# Patient Record
Sex: Male | Born: 1975 | Race: White | Hispanic: No | Marital: Married | Smoking: Former smoker
Health system: Southern US, Community
[De-identification: ages and names within clinical notes are randomized; demographics above are authoritative.]

## PROBLEM LIST (undated history)

## (undated) DIAGNOSIS — F32A Depression, unspecified: Secondary | ICD-10-CM

## (undated) DIAGNOSIS — I10 Essential (primary) hypertension: Secondary | ICD-10-CM

## (undated) DIAGNOSIS — K219 Gastro-esophageal reflux disease without esophagitis: Secondary | ICD-10-CM

## (undated) DIAGNOSIS — F329 Major depressive disorder, single episode, unspecified: Secondary | ICD-10-CM

## (undated) DIAGNOSIS — F419 Anxiety disorder, unspecified: Secondary | ICD-10-CM

## (undated) HISTORY — DX: Anxiety disorder, unspecified: F41.9

## (undated) HISTORY — DX: Major depressive disorder, single episode, unspecified: F32.9

## (undated) HISTORY — DX: Depression, unspecified: F32.A

## (undated) HISTORY — DX: Gastro-esophageal reflux disease without esophagitis: K21.9

## (undated) HISTORY — DX: Essential (primary) hypertension: I10

---

## 2004-12-03 ENCOUNTER — Emergency Department (HOSPITAL_COMMUNITY): Admission: EM | Admit: 2004-12-03 | Discharge: 2004-12-03 | Payer: Self-pay | Admitting: Emergency Medicine

## 2005-03-15 ENCOUNTER — Encounter: Admission: RE | Admit: 2005-03-15 | Discharge: 2005-03-15 | Payer: Self-pay | Admitting: Family Medicine

## 2008-06-28 ENCOUNTER — Emergency Department (HOSPITAL_COMMUNITY): Admission: EM | Admit: 2008-06-28 | Discharge: 2008-06-28 | Payer: Self-pay | Admitting: Family Medicine

## 2008-06-28 ENCOUNTER — Emergency Department (HOSPITAL_COMMUNITY): Admission: EM | Admit: 2008-06-28 | Discharge: 2008-06-28 | Payer: Self-pay | Admitting: Emergency Medicine

## 2008-07-27 ENCOUNTER — Inpatient Hospital Stay (HOSPITAL_COMMUNITY): Admission: AD | Admit: 2008-07-27 | Discharge: 2008-07-31 | Payer: Self-pay | Admitting: Psychiatry

## 2008-07-27 ENCOUNTER — Ambulatory Visit: Payer: Self-pay | Admitting: Psychiatry

## 2008-08-02 ENCOUNTER — Other Ambulatory Visit (HOSPITAL_COMMUNITY): Admission: RE | Admit: 2008-08-02 | Discharge: 2008-08-24 | Payer: Self-pay | Admitting: Psychiatry

## 2008-09-13 ENCOUNTER — Inpatient Hospital Stay (HOSPITAL_COMMUNITY): Admission: AD | Admit: 2008-09-13 | Discharge: 2008-09-21 | Payer: Self-pay | Admitting: Psychiatry

## 2008-09-13 ENCOUNTER — Ambulatory Visit: Payer: Self-pay | Admitting: Psychiatry

## 2008-09-27 ENCOUNTER — Ambulatory Visit (HOSPITAL_COMMUNITY): Payer: Self-pay | Admitting: Psychiatry

## 2010-10-17 LAB — URINALYSIS, ROUTINE W REFLEX MICROSCOPIC
Hgb urine dipstick: NEGATIVE
Ketones, ur: 15 mg/dL — AB
Nitrite: NEGATIVE
Protein, ur: NEGATIVE mg/dL
Specific Gravity, Urine: 1.04 — ABNORMAL HIGH (ref 1.005–1.030)
Urobilinogen, UA: 1 mg/dL (ref 0.0–1.0)

## 2010-10-17 LAB — DRUGS OF ABUSE SCREEN W/O ALC, ROUTINE URINE
Amphetamine Screen, Ur: NEGATIVE
Benzodiazepines.: NEGATIVE
Marijuana Metabolite: NEGATIVE
Methadone: NEGATIVE

## 2010-10-17 LAB — COMPREHENSIVE METABOLIC PANEL
ALT: 12 U/L (ref 0–53)
AST: 12 U/L (ref 0–37)
Albumin: 4.6 g/dL (ref 3.5–5.2)
Alkaline Phosphatase: 61 U/L (ref 39–117)
CO2: 26 mEq/L (ref 19–32)
Calcium: 9.4 mg/dL (ref 8.4–10.5)
Creatinine, Ser: 0.97 mg/dL (ref 0.4–1.5)
GFR calc Af Amer: >60 mL/min (ref >60)
Sodium: 139 mEq/L (ref 135–145)

## 2010-10-17 LAB — CBC
MCHC: 33.6 g/dL (ref 30.0–36.0)
MCV: 84.9 fL (ref 78.0–100.0)
Platelets: 271 10*3/uL (ref 150–400)
WBC: 9.6 10*3/uL (ref 4.0–10.5)

## 2010-10-17 LAB — URINE MICROSCOPIC-ADD ON

## 2010-10-21 LAB — DIFFERENTIAL
Basophils Absolute: 0 10*3/uL (ref 0.0–0.1)
Basophils Relative: 1 % (ref 0–1)
Eosinophils Absolute: 0.4 10*3/uL (ref 0.0–0.7)
Eosinophils Relative: 5 % (ref 0–5)
Monocytes Absolute: 0.6 10*3/uL (ref 0.1–1.0)
Monocytes Relative: 9 % (ref 3–12)
Neutro Abs: 3.8 10*3/uL (ref 1.7–7.7)
Neutrophils Relative %: 52 % (ref 43–77)

## 2010-10-21 LAB — COMPREHENSIVE METABOLIC PANEL
ALT: 18 U/L (ref 0–53)
AST: 17 U/L (ref 0–37)
Albumin: 4.2 g/dL (ref 3.5–5.2)
Alkaline Phosphatase: 56 U/L (ref 39–117)
CO2: 27 mEq/L (ref 19–32)
Calcium: 9.6 mg/dL (ref 8.4–10.5)
Chloride: 102 mEq/L (ref 96–112)
Creatinine, Ser: 1.13 mg/dL (ref 0.4–1.5)
GFR calc Af Amer: >60 mL/min (ref >60)
GFR calc non Af Amer: >60 mL/min (ref >60)
Potassium: 3.7 mEq/L (ref 3.5–5.1)
Total Bilirubin: 0.5 mg/dL (ref 0.3–1.2)
Total Protein: 6.5 g/dL (ref 6.0–8.3)

## 2010-10-21 LAB — DRUGS OF ABUSE SCREEN W/O ALC, ROUTINE URINE
Amphetamine Screen, Ur: NEGATIVE
Barbiturate Quant, Ur: NEGATIVE
Benzodiazepines.: NEGATIVE
Marijuana Metabolite: NEGATIVE
Methadone: NEGATIVE
Opiate Screen, Urine: NEGATIVE

## 2010-10-21 LAB — CBC: Hemoglobin: 15.5 g/dL (ref 13.0–17.0)

## 2010-10-21 LAB — COCAINE, URINE, CONFIRMATION: Benzoylecgonine GC/MS Conf: 120000 ng/mL

## 2010-11-19 NOTE — H&P (Signed)
Wesley Fletcher, Wesley Fletcher NO.:  [id number]   MEDICAL RECORD NO.:  [id number]          PATIENT TYPE:  IPS   LOCATION:  0501                          FACILITY:  [facility]   PHYSICIAN:  Geoffery Lyons, M.D.      DATE OF BIRTH:  August 20, 1975   DATE OF ADMISSION:  09/13/2008  DATE OF DISCHARGE:                       PSYCHIATRIC ADMISSION ASSESSMENT   HISTORY OF PRESENT ILLNESS:  The patient is here due to altered mental  status, apparently was using alcohol and smoking marijuana.  He was  brought to the facility per his wife who was concerned about his mental  status.  He denies any hallucinations, denies any suicidality.   PAST PSYCHIATRIC HISTORY:  The patient was here in January 2010 for  stress conflict with his wife and some substance use.  Apparently, he  has been on Paxil and Wellbutrin in the past.   SOCIAL HISTORY:  This is a 35 year old male who lives in [place].  He  is currently unemployed.  He is married.   FAMILY HISTORY:  None.   ALCOHOL/DRUG HISTORY:  He admits to using some recent alcohol and  marijuana with a past history of cocaine use.   PRIMARY CARE Marithza Malachi:  None.   MEDICAL PROBLEMS:  He denies any acute or chronic health issues.   MEDICATIONS:  1. Wellbutrin 150 mg.  2. Trazodone 100 mg at bedtime.  3. Paxil 30 mg daily.   DRUG ALLERGIES:  No known allergies.   REVIEW OF SYSTEMS:  The patient denies any fever, chills, insomnia, no  weight gain or chest pain, shortness of breath, nausea, vomiting, joint  pain, seizures, headaches or depression.   PAST SURGICAL HISTORY:  None.   PHYSICAL EXAMINATION:  VITAL SIGNS:  Temperature 97.9, heart rate 80,  respirations 20, blood pressure 127/82.  He is 6 feet 1 inch tall, 194  pounds. GENERAL APPEARANCE:  A healthy-appearing male in no acute  distress.  HEENT:  Head is atraumatic.  NECK:  No lymphadenopathy.  CHEST:  Clear, no wheezes.  No rales.  BREAST EXAM:  Deferred.  HEART:  Regular rate and  rhythm.  No murmurs, gallops or rubs.  ABDOMEN:  Soft, nontender and nondistended abdomen.  PELVIC AND GU EXAMS:  Deferred.  EXTREMITIES:  Moves all extremities.  No clubbing, no edema, 5+ against  resistance.  SKIN:  Warm and dry with no obvious rashes or lacerations noted.  NEUROLOGICAL FINDINGS:  Intact with no tremors.  The patient walks  upright with a steady gait.   LABORATORY DATA:  Shows a glucose of 110.  CBC within normal limits.  Urinalysis shows amber turbid urine.  Urine drug screen is negative.   MENTAL STATUS EXAMINATION:  The patient is fully alert and cooperative  with good eye contact.  No obvious thought disorder; polite.  His memory  appears intact, although he is a poor historian as to recent events and  circumstances that surrounded admission.  Judgment and insight are fair.   Axis I:  Polysubstance abuse, rule out substance-induced psychosis.  Axis II:  Deferred.  Axis III:  No known medical conditions.  Axis IV:  Psychosocial problems related to chronic substance use.  Axis V:  Current is 40-45.   PLAN:  Contract for safety.  We will have a family session for  background information.  Concerns at home.  Will review patient's  medication regime.  The patient will be in the red group at this time.  Tentative length of stay is 2-3 days.      Landry Corporal, N.P.      Geoffery Lyons, M.D.  Electronically Signed    JO/MEDQ  D:  09/20/2008  T:  09/20/2008  Job:  [id number]

## 2010-11-19 NOTE — Discharge Summary (Signed)
NAMESARKIS, Wesley Fletcher NO.:  [id number]   MEDICAL RECORD NO.:  [id number]          PATIENT TYPE:  IPS   LOCATION:  0503                          FACILITY:  [facility]   PHYSICIAN:  Geoffery Lyons, M.D.      DATE OF BIRTH:  07-Jul-1976   DATE OF ADMISSION:  07/27/2008  DATE OF DISCHARGE:  07/31/2008                               DISCHARGE SUMMARY   CHIEF COMPLAINT:  This was the was the first admission to [facility] for this 35 year old male married x2 divorced x1 with  3 children in 74, 9 and 5.  Says he had been under a lot of stress,  conflict with the ex-wife.  He claimed that she had cheated twice on  him.  He remarried.  This is a better relationship.  The kids are having  a hard time going back and forth.  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  Increased hostility from the clients, stress,  increasingly more irritable, easily agitated, afraid of losing control.  Admits to using cocaine, increased use, increased amounts.   PAST PSYCHIATRIC HISTORY:  Had been on Paxil and Wellbutrin.  Had been  on Xanax.  He used to take the Xanax when he one of those episodes, when  he felt he was losing control.  He started taking increased amount, 3  and 4 at a time.   SECONDARY HISTORY:  From 17 to 44, has used LSD, marijuana, mushrooms,  MDA, peyote with some perceptual changes, mostly visual.   MEDICAL HISTORY:  Noncontributory.   PHYSICAL EXAM:  Failed to show any acute findings.   LABORATORY WORKUP:  UDS positive for cocaine.  CBC within normal limits.  BMET within normal limits.   MENTAL STATUS EXAMINATION:  Reveals alert cooperative, spontaneous man,  alert and cooperative spontaneous male, wanting to get better, wanting  to quit.  Endorsed using is affecting his ability to function, affecting  relationships.  Understanding that he needed to make a lot of changes in  his life.  No active suicidal or homicidal ideas.  No  hallucinations or  delusions.  Cognition well preserved.   ADMITTING DIAGNOSES:  AXIS I:  Cocaine abuse, rule out dependence.  Mood  disorder not otherwise specified.  AXIS II:  No diagnosis.  AXIS III:  Active hypertension.  AXIS IV:  Moderate.  AXIS V:  On admission 35.  Highest GAF in the last year 60.   COURSE IN THE HOSPITAL:  Was admitted, started individual and group  psychotherapy.  We provided some Symmetrel for cravings.  He was kept on  his Paxil as well as the Wellbutrin.  He was given trazodone for sleep  and he continued to endorse visual disturbances, different patterns in  the wall.  There was a family session with his wife.  He endorsed stress  from work.  Wife reports that he gets home and just sleeps.  No  motivation.  He endorsed having little interest in things, feeling he  was just concerned for everyone else.  He endorsed that he is ashamed  and does not want to be a burden, so does not share with her.  Has been  using cocaine for the last year.  Wife was aware of this.  Overall, the  session went well.  The next 48 hours continued to work on coping skills  and January 25 he was in full contact reality.  There were no active  suicidal ideas, no delusions.  No hallucinations.  He was willing and  motivated to pursue further work on recovery.   DISCHARGE DIAGNOSES:  AXIS I: Cocaine dependence.  Mood disorder not  otherwise specified.  Anxiety disorder not otherwise specified.  AXIS II:  No diagnosis.  AXIS III:  Hypertension.  AXIS IV:  Moderate.  AXIS V:  On discharge 60.   DISCHARGE MEDICATIONS:  1. Paxil 70 mg per day.  2. Symmetrel 100 mg twice a day.  3. Wellbutrin XL 150 mg in the morning.  4. Trazodone 100 one-half to one at night for sleep.   FOLLOWUP:  CD IOP.      Geoffery Lyons, M.D.  Electronically Signed     IL/MEDQ  D:  08/12/2008  T:  08/13/2008  Job:  [id number]

## 2010-11-22 NOTE — Discharge Summary (Signed)
Wesley Fletcher, Wesley Fletcher   MEDICAL RECORD NO.:  [id number]          PATIENT TYPE:  IPS   LOCATION:  0501                          FACILITY:  [facility]   PHYSICIAN:  Geoffery Lyons, M.D.      DATE OF BIRTH:  Nov 07, 1975   DATE OF ADMISSION:  09/13/2008  DATE OF DISCHARGE:  09/21/2008                               DISCHARGE SUMMARY   CHIEF COMPLAINT AND PRESENT ILLNESS:  This was the second admission to  [facility] for this 35 year old male that was admitted  with altered mental status.  Apparently was using alcohol and smoking  marijuana.  Brought to this facility by his wife who was concerned about  his mental state.   PAST PSYCHIATRIC HISTORY:  Was admitted on January 2010, has some  conflict with his wife and some substance abuse.  Had been on Paxil and  Wellbutrin in the past.   ALCOHOL AND DRUG HISTORY:  As already stated, abuse of alcohol and  marijuana, past history of cocaine abuse.   MEDICAL HISTORY:  Noncontributory.   MEDICATIONS:  1. Wellbutrin 150 mg per day.  2. Trazodone 100 mg at bedtime.  3. Paxil 30 at bedtime for sleep.   PHYSICAL EXAMINATION:  Failed to show any acute findings.   LABORATORY WORK:  White blood cells 9.6, hemoglobin 16.1.  Sodium 139,  potassium 3.5, glucose 110.  SGOT 12, SGPT 12, total bilirubin 0.8.  Drug screening negative for substances of abuse.   MENTAL STATUS EXAMINATION:  Exam reveals alert cooperative male with  good eye contact.  Mood:  Anxiety.  Affect:  Anxiety.  Ruminative  circumstantial, somewhat tangential, but no active suicidal or homicidal  ideas.  No delusions.  No hallucinations.  Cognition:  Well-preserved.   ADMISSION DIAGNOSES:  AXIS I:  Polysubstance abuse, rule out substance-  induced mental status change.  AXIS II:  No diagnosis.  AXIS III:  No diagnosis.  AXIS IV:  Moderate.  AXIS V:  On admission 35-40, highest GAF in the last year 70.   COURSE IN THE HOSPITAL:   Patient was admitted, started in individual and  group psychotherapy.  Initially, he endorsed that he was admitted  because he relapsed, was wanting to leave and go with his family.  Claimed that he was __________ the day before because I had delusions.  March 12, he was more disorganized, loud, tearful, uncontrollable  crying, thinking about his son, referring him in an abstract way I have  him in my heart.  Having memories of him being a 35 year old and  playing the guitar.  He did admit that he sees himself as an addict.  Was a family session with his wife.  They have been dealing with her  parents due to financial concerns.  She did say that [place] relied  heavily on her.  She was concerned about his mental status changes.  Concerned that they might be impairment and due to years of past drug  use.  After this incident, her parents did not want [place] back in their  home.  He had  a hard time focusing on the task in front of him.  At  times, his answers were irrelevant.  By March 13, he was more organized,  able to channel other events in his life in which he was involved with  drugs.  Afraid that he had an overdose of LSD where he was preparing to  sell it, concerned that it might have affected his brain.  Did admit to  episodes of paranoia.  While interacting with this physician, he was  suspicious, and he was able to recognize that.  He was already on  Zyprexa.  We increased it to 10 mg at night.  March 14, he was somewhat  better, less labile.  It was clear he was not going to be able to go  home with his wife, so he would not have to find another accommodation.  March 15, he was ruminating, able to say that he has experienced  paranoia for a long time.  Also endorsed that the mother shared that he  had some compulsive behaviors in the past.  He used to wash his hands  over and over again.  Also has some social anxiety.  Endorsed difficulty  with memory, still ruminating.  Looking  into options after discharge,  they were looking at a halfway house.  He evidenced difficulty with  concentration and with short-term memory.  March 16, still somewhat  disorganized, dealing with all the losses through the years, worried  about the outcome of the relationship between him and his wife,  uncertain as what was going to happen once he gets out, where would he  go, how can he find a job, how can he be able to make enough money to  keep the halfway house going.  He was somewhat labile, but March 17, he  was more organized.  He was able to start drawing, endorsed he has  increased awareness, increased ability to focus on his drawings.  Overall, he felt better, marked decrease in the distractibility and the  lability of affect.  Did say that he had a lot of unresolved grief  issues that he needs to address.  Continued to work with the Zyprexa.  We worked on Pharmacologist, grief and loss, and we worked towards  improving reality testing.  By March 18, he was better, still not  completely 100%, but able to stay focused, able to with help of the  skills he had just learned keep himself on task, focused, organized.  He  is going to a halfway house.  He has to make sure he was able to get a  job that will help to support himself and his family.  Upon discharge,  in full contact with reality.  No active suicidal or homicidal ideas.  No hallucinations or delusions.   DISCHARGE DIET:  AXIS I:  Anxiety disorder, not otherwise specified.  Mood disorder, not otherwise specified.  Polysubstance abuse.  Psychotic  disorder, not otherwise specified.  AXIS II:  No diagnosis.  AXIS III:  No diagnosis.  AXIS IV:  Moderate.  AXIS V:  On discharge 50.   Discharged on:  1. Zyprexa 5 mg twice a day and 10 mg at bedtime.  2. Trazodone 100 mg at bedtime.   FOLLOW-UP:  Saul Fordyce, [facility].      Geoffery Lyons, M.D.  Electronically Signed     IL/MEDQ  D:  10/17/2008  T:   10/17/2008  Job:  [id number]

## 2010-12-07 ENCOUNTER — Emergency Department (HOSPITAL_COMMUNITY): Payer: 59

## 2010-12-07 ENCOUNTER — Emergency Department (HOSPITAL_COMMUNITY)
Admission: EM | Admit: 2010-12-07 | Discharge: 2010-12-07 | Disposition: A | Discharge status: Home / Self Care | Payer: 59 | Attending: Emergency Medicine | Admitting: Emergency Medicine

## 2010-12-07 DIAGNOSIS — R072 Precordial pain: Secondary | ICD-10-CM | POA: Insufficient documentation

## 2010-12-07 DIAGNOSIS — I10 Essential (primary) hypertension: Secondary | ICD-10-CM | POA: Insufficient documentation

## 2010-12-07 LAB — CBC
HCT: 42.9 % (ref 39.0–52.0)
MCV: 79.4 fL (ref 78.0–100.0)
Platelets: 205 10*3/uL (ref 150–400)
RBC: 5.4 MIL/uL (ref 4.22–5.81)
RDW: 12.5 % (ref 11.5–15.5)
WBC: 5.8 10*3/uL (ref 4.0–10.5)

## 2010-12-07 LAB — BASIC METABOLIC PANEL
BUN: 26 mg/dL — ABNORMAL HIGH (ref 6–23)
Creatinine, Ser: 0.78 mg/dL (ref 0.4–1.5)
GFR calc Af Amer: >60 mL/min (ref >60)
GFR calc non Af Amer: >60 mL/min (ref >60)
Potassium: 4 mEq/L (ref 3.5–5.1)

## 2010-12-07 LAB — RAPID URINE DRUG SCREEN, HOSP PERFORMED
Barbiturates: NOT DETECTED
Cocaine: NOT DETECTED
Tetrahydrocannabinol: NOT DETECTED

## 2010-12-07 LAB — DIFFERENTIAL
Basophils Absolute: 0 10*3/uL (ref 0.0–0.1)
Basophils Relative: 0 % (ref 0–1)
Eosinophils Absolute: 0.2 10*3/uL (ref 0.0–0.7)
Eosinophils Relative: 3 % (ref 0–5)
Lymphs Abs: 2.3 10*3/uL (ref 0.7–4.0)
Neutrophils Relative %: 45 % (ref 43–77)

## 2010-12-07 LAB — CK TOTAL AND CKMB (NOT AT ARMC): Relative Index: 1.4 (ref 0.0–2.5)

## 2010-12-07 LAB — TROPONIN I: Troponin I: <0.3 ng/mL (ref <0.30)

## 2010-12-12 ENCOUNTER — Encounter (HOSPITAL_COMMUNITY): Payer: 59 | Admitting: Radiology

## 2010-12-18 ENCOUNTER — Encounter (HOSPITAL_COMMUNITY): Payer: 59 | Admitting: Radiology

## 2011-01-14 ENCOUNTER — Encounter: Payer: Self-pay | Admitting: *Deleted

## 2011-01-14 ENCOUNTER — Encounter: Payer: Self-pay | Admitting: Cardiovascular Disease

## 2011-01-15 ENCOUNTER — Encounter: Payer: Self-pay | Admitting: Cardiovascular Disease

## 2011-01-15 ENCOUNTER — Ambulatory Visit (INDEPENDENT_AMBULATORY_CARE_PROVIDER_SITE_OTHER): Payer: Managed Care, Other (non HMO) | Admitting: Cardiovascular Disease

## 2011-01-15 DIAGNOSIS — R079 Chest pain, unspecified: Secondary | ICD-10-CM

## 2011-01-15 DIAGNOSIS — R002 Palpitations: Secondary | ICD-10-CM

## 2011-01-15 NOTE — Assessment & Plan Note (Signed)
Atypical negative ER w/U  F/U stress echo

## 2011-01-15 NOTE — Progress Notes (Signed)
35 yo seen in ER for atypical SSCP on 6/2.  Reviewed records, labs and ECG.  R/O, CXR NAD and ECG normal.  Pain atypical sharp and fleeting.  Has had occasional palpitations with premature beat and strong second beat.  Currently not recurring.  Previous history of drug use but not last 3 years at least.  No excess caffeine.  Lots of stress Works for [employer] which is in bankrupcy and has 3 kids under the age of 73 and a 4th on the way.  No syncope, family history of premature CAD or collapse.    ROS: Denies fever, malais, weight loss, blurry vision, decreased visual acuity, cough, sputum, SOB, hemoptysis, pleuritic pain, palpitaitons, heartburn, abdominal pain, melena, lower extremity edema, claudication, or rash.  All other systems reviewed and negative   General: Affect appropriate Healthy:  appears stated age HEENT: normal Neck supple with no adenopathy JVP normal no bruits no thyromegaly Lungs clear with no wheezing and good diaphragmatic motion Heart:  S1/S2 no murmur,rub, gallop or click PMI normal Abdomen: benighn, BS positve, no tenderness, no AAA no bruit.  No HSM or HJR Distal pulses intact with no bruits No edema Neuro non-focal Skin warm and dry No muscular weakness  Medications Current Outpatient Prescriptions  Medication Sig Dispense Refill  . aspirin 81 MG tablet Take 81 mg by mouth daily.        . Multiple Vitamin (MULTIVITAMIN) capsule Take 1 capsule by mouth daily.        . NON FORMULARY St john wart As directed         Allergies Review of patient's allergies indicates no known allergies.  Family History: No family history on file.  Social History: History   Social History  . Marital Status: Married    Spouse Name: N/A    Number of Children: N/A  . Years of Education: N/A   Occupational History  . Not on file.   Social History Main Topics  . Smoking status: Current Some Day Smoker  . Smokeless tobacco: Not on file  . Alcohol Use: Not on file  .  Drug Use: Not on file  . Sexually Active: Not on file   Other Topics Concern  . Not on file   Social History Narrative  . No narrative on file    Electrocardiogram:  12/07/10  NSR 69 Normal ECG  Assessment and Plan

## 2011-01-15 NOTE — Assessment & Plan Note (Signed)
Benign related to stress No need for further w.u unless stress echo abnormal

## 2011-01-29 ENCOUNTER — Ambulatory Visit (HOSPITAL_COMMUNITY): Payer: Managed Care, Other (non HMO) | Admitting: Radiology

## 2013-07-18 ENCOUNTER — Ambulatory Visit (INDEPENDENT_AMBULATORY_CARE_PROVIDER_SITE_OTHER): Payer: BC Managed Care – PPO | Admitting: Physician Assistant

## 2013-07-18 VITALS — BP 122/76 | HR 76 | Temp 98.3°F | Resp 12 | Ht 73.0 in | Wt 219.0 lb

## 2013-07-18 DIAGNOSIS — L309 Dermatitis, unspecified: Secondary | ICD-10-CM

## 2013-07-18 DIAGNOSIS — L299 Pruritus, unspecified: Secondary | ICD-10-CM

## 2013-07-18 DIAGNOSIS — L259 Unspecified contact dermatitis, unspecified cause: Secondary | ICD-10-CM

## 2013-07-18 MED ORDER — HYDROXYZINE HCL 25 MG PO TABS: 12.5000 mg | ORAL_TABLET | Freq: Every evening | ORAL | Status: DC | PRN

## 2013-07-18 MED ORDER — PREDNISONE 20 MG PO TABS: ORAL_TABLET | ORAL | Status: DC

## 2013-07-18 NOTE — Progress Notes (Signed)
   Subjective:    Patient ID: Wesley BouchardCurtis Fletcher, male    DOB: 12/14/1975, 38 y.o.   MRN: [id number]  Reviewed patient medication, past medical history, social history and family history.  HPI  Arms starting itching and breaking out with rash on 07/15/2013.  Has been gradually spreading every since it started.  Now covering arms, legs, trunk, back and neck. Has taken Benadryl 25 mg BID x 2 days but does not seem to improve itching.  No change in detergent or soap.  No fever, sore throat or nasal congestion.   Is having trouble paying attention.  Getting lost when driving.  Having issues staying on task at work. Has to keep door closed to stay focused.  Diagnosed in 1992 with ADD but never took medication.  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  Noticed attention span worsening over past 6 years.   Review of Systems As written above.     Objective:   Physical Exam  Constitutional: He is oriented to person, place, and time. Vital signs are normal. He appears well-developed and well-nourished.  Cardiovascular: Normal rate, regular rhythm and normal heart sounds.   Pulmonary/Chest: Effort normal and breath sounds normal.  Neurological: He is alert and oriented to person, place, and time.  Skin: Skin is warm and dry. Rash noted. Rash is papular.  Erythematous, tiny and small papular rash on arms, legs, neck, back and chest.            Assessment & Plan:  1. Pruritus - hydrOXYzine (ATARAX/VISTARIL) 25 MG tablet; Take 0.5-1 tablets (12.5-25 mg total) by mouth at bedtime as needed for itching.  Dispense: 30 tablet; Refill: 0  2. Dermatitis - predniSONE (DELTASONE) 20 MG tablet; Take 3 PO QAM x3days, 2 PO QAM x3days, 1 PO QAM x3days  Dispense: 18 tablet; Refill: 0

## 2013-07-18 NOTE — Progress Notes (Signed)
I have examined this patient along with the student and agree. Undetermined etiology. Counseled regarding dry skin care, which may help, though his skin is not particularly dry.  Encouraged him to establish for primary care to further evaluate the ADD symptoms and to initiate treatment.

## 2013-07-18 NOTE — Patient Instructions (Addendum)
We encourage you to establish with PCP of your choice at your earliest convenience.   Drink at least 64 ounces of water daily. Consider a humidifier for the room where you sleep. Bathe once daily. Avoid using HOT water, as it dries skin. Avoid deodorant soaps (Dial is the worst!) and stick with gentle cleansers (I like Cetaphil Liquid Cleanser). After bathing, dry off completely, then apply a thick emollient cream (I like Cetaphil Moisturizing Cream). Apply the cream twice daily, or more!

## 2013-10-26 ENCOUNTER — Ambulatory Visit (INDEPENDENT_AMBULATORY_CARE_PROVIDER_SITE_OTHER): Payer: BC Managed Care – PPO | Admitting: Family Medicine

## 2013-10-26 ENCOUNTER — Encounter: Payer: Self-pay | Admitting: Family Medicine

## 2013-10-26 VITALS — BP 120/80 | HR 75 | Temp 98.5°F | Ht 73.0 in | Wt 223.0 lb

## 2013-10-26 DIAGNOSIS — Z7689 Persons encountering health services in other specified circumstances: Secondary | ICD-10-CM

## 2013-10-26 DIAGNOSIS — Z7189 Other specified counseling: Secondary | ICD-10-CM

## 2013-10-26 DIAGNOSIS — K219 Gastro-esophageal reflux disease without esophagitis: Secondary | ICD-10-CM

## 2013-10-26 DIAGNOSIS — F3289 Other specified depressive episodes: Secondary | ICD-10-CM

## 2013-10-26 DIAGNOSIS — F32A Depression, unspecified: Secondary | ICD-10-CM

## 2013-10-26 DIAGNOSIS — F329 Major depressive disorder, single episode, unspecified: Secondary | ICD-10-CM

## 2013-10-26 NOTE — Progress Notes (Signed)
Pre visit review using our clinic review tool, if applicable. No additional management support is needed unless otherwise documented below in the visit note. 

## 2013-10-26 NOTE — Patient Instructions (Signed)
-  PLEASE SIGN UP FOR MYCHART TODAY   We recommend the following healthy lifestyle measures: - eat a healthy diet consisting of lots of vegetables, fruits, beans, nuts, seeds, healthy meats such as white chicken and fish and whole grains.  - avoid fried foods, fast food, processed foods, sodas, red meet and other fattening foods.  - get a least 150 minutes of aerobic exercise per week.   Follow up in: next few months for physical with labs  Diet for Gastroesophageal Reflux Disease, Adult Reflux (acid reflux) is when acid from your stomach flows up into the esophagus. When acid comes in contact with the esophagus, the acid causes irritation and soreness (inflammation) in the esophagus. When reflux happens often or so severely that it causes damage to the esophagus, it is called gastroesophageal reflux disease (GERD). Nutrition therapy can help ease the discomfort of GERD. FOODS OR DRINKS TO AVOID OR LIMIT  Smoking or chewing tobacco. Nicotine is one of the most potent stimulants to acid production in the gastrointestinal tract.  Caffeinated and decaffeinated coffee and black tea.  Regular or low-calorie carbonated beverages or energy drinks (caffeine-free carbonated beverages are allowed).   Strong spices, such as black pepper, white pepper, red pepper, cayenne, curry powder, and chili powder.  Peppermint or spearmint.  Chocolate.  High-fat foods, including meats and fried foods. Extra added fats including oils, butter, salad dressings, and nuts. Limit these to less than 8 tsp per day.  Fruits and vegetables if they are not tolerated, such as citrus fruits or tomatoes.  Alcohol.  Any food that seems to aggravate your condition. If you have questions regarding your diet, call your caregiver or a registered dietitian. OTHER THINGS THAT MAY HELP GERD INCLUDE:   Eating your meals slowly, in a relaxed setting.  Eating 5 to 6 small meals per day instead of 3 large meals.  Eliminating  food for a period of time if it causes distress.  Not lying down until 3 hours after eating a meal.  Keeping the head of your bed raised 6 to 9 inches (15 to 23 cm) by using a foam wedge or blocks under the legs of the bed. Lying flat may make symptoms worse.  Being physically active. Weight loss may be helpful in reducing reflux in overweight or obese adults.  Wear loose fitting clothing EXAMPLE MEAL PLAN This meal plan is approximately 2,000 calories based on https://www.[place].org/ChooseMyPlate.gov meal planning guidelines. Breakfast   cup cooked oatmeal.  1 cup strawberries.  1 cup low-fat milk.  1 oz almonds. Snack  1 cup cucumber slices.  6 oz yogurt (made from low-fat or fat-free milk). Lunch  2 slice whole-wheat bread.  2 oz sliced [place]urkey.  2 tsp mayonnaise.  1 cup blueberries.  1 cup snap peas. Snack  6 whole-wheat crackers.  1 oz string cheese. Dinner   cup brown rice.  1 cup mixed veggies.  1 tsp olive oil.  3 oz grilled fish. Document Released: 06/23/2005 Document Revised: 09/15/2011 Document Reviewed: 05/09/2011 [facility] Patient Information 2014 [place], [place].

## 2013-10-26 NOTE — Progress Notes (Signed)
No chief complaint on file.   HPI:  Wesley BouchardCurtis Fletcher is here to establish care. Did not have PCP in the past. Last PCP and physical: no, has been several years  Has the following chronic problems and concerns today:  Depression: -when going thru divorce was on xanax and wellbutrin - did not like these -has been taking Dt. John's wort for 3 years and feels great on this (half the recommended dose) -reports doing great on this  GERD: -a few times per week -mild, wants to work on diet   Patient Active Problem List   Diagnosis Date Noted  . GERD (gastroesophageal reflux disease) 10/26/2013   Health Maintenance: -wants to get tdap at physical  ROS: See pertinent positives and negatives per HPI.  Past Medical History  Diagnosis Date  . Chest pain   . Anxiety   . Depression   . HTN (hypertension)     mild, when under stress from divorce    Family History  Problem Relation Age of Onset  . Hypertension Father   . ALS Paternal Grandfather     History   Social History  . Marital Status: Married    Spouse Name: N/A    Number of Children: N/A  . Years of Education: N/A   Social History Main Topics  . Smoking status: Former Smoker    Types: Cigarettes    Quit date: 07/19/2011  . Smokeless tobacco: None  . Alcohol Use: 0.5 - 2.0 oz/week    1-4 drink(s) per week  . Drug Use: No  . Sexual Activity: None   Other Topics Concern  . None   Social History Narrative   Work or School: [employer], route Physicist, medicaloperations manager      Home Situation: lives with wife Noreene LarssonJill and 4 children and another on the way       Spiritual Beliefs: [place]      Lifestyle: no regular exercise; diet is not good                Current outpatient prescriptions:ST JOHNS WORT PO, Take by mouth., Disp: , Rfl:   EXAM:  Filed Vitals:   10/26/13 0947  BP: 120/80  Pulse: 75  Temp: 98.5 F (36.9 C)    Body mass index is 29.43 kg/(m^2).  GENERAL: vitals reviewed and listed above, alert,  oriented, appears well hydrated and in no acute distress  HEENT: atraumatic, conjunttiva clear, no obvious abnormalities on inspection of external nose and ears  NECK: no obvious masses on inspection  LUNGS: clear to auscultation bilaterally, no wheezes, rales or rhonchi, good air movement  CV: HRRR, no peripheral edema  MS: moves all extremities without noticeable abnormality  PSYCH: pleasant and cooperative, no obvious depression or anxiety  ASSESSMENT AND PLAN:  Discussed the following assessment and plan:  GERD (gastroesophageal reflux disease)  Encounter to establish care  Depression   -We reviewed the PMH, PSH, FH, SH, Meds and Allergies. -We provided refills for any medications we will prescribe as needed. -We addressed current concerns per orders and patient instructions. -We have asked for records for pertinent exams, studies, vaccines and notes from previous providers. -We have advised patient to follow up per instructions below.   -Patient advised to return or notify a doctor immediately if symptoms worsen or persist or new concerns arise.  Patient Instructions  -PLEASE SIGN UP FOR MYCHART TODAY   We recommend the following healthy lifestyle measures: - eat a healthy diet consisting of lots of vegetables, fruits,  beans, nuts, seeds, healthy meats such as white chicken and fish and whole grains.  - avoid fried foods, fast food, processed foods, sodas, red meet and other fattening foods.  - get a least 150 minutes of aerobic exercise per week.   Follow up in: next few months for physical with labs  Diet for Gastroesophageal Reflux Disease, Adult Reflux (acid reflux) is when acid from your stomach flows up into the esophagus. When acid comes in contact with the esophagus, the acid causes irritation and soreness (inflammation) in the esophagus. When reflux happens often or so severely that it causes damage to the esophagus, it is called gastroesophageal reflux  disease (GERD). Nutrition therapy can help ease the discomfort of GERD. FOODS OR DRINKS TO AVOID OR LIMIT  Smoking or chewing tobacco. Nicotine is one of the most potent stimulants to acid production in the gastrointestinal tract.  Caffeinated and decaffeinated coffee and black tea.  Regular or low-calorie carbonated beverages or energy drinks (caffeine-free carbonated beverages are allowed).   Strong spices, such as black pepper, white pepper, red pepper, cayenne, curry powder, and chili powder.  Peppermint or spearmint.  Chocolate.  High-fat foods, including meats and fried foods. Extra added fats including oils, butter, salad dressings, and nuts. Limit these to less than 8 tsp per day.  Fruits and vegetables if they are not tolerated, such as citrus fruits or tomatoes.  Alcohol.  Any food that seems to aggravate your condition. If you have questions regarding your diet, call your caregiver or a registered dietitian. OTHER THINGS THAT MAY HELP GERD INCLUDE:   Eating your meals slowly, in a relaxed setting.  Eating 5 to 6 small meals per day instead of 3 large meals.  Eliminating food for a period of time if it causes distress.  Not lying down until 3 hours after eating a meal.  Keeping the head of your bed raised 6 to 9 inches (15 to 23 cm) by using a foam wedge or blocks under the legs of the bed. Lying flat may make symptoms worse.  Being physically active. Weight loss may be helpful in reducing reflux in overweight or obese adults.  Wear loose fitting clothing EXAMPLE MEAL PLAN This meal plan is approximately 2,000 calories based on https://www.[place].org/ChooseMyPlate.gov meal planning guidelines. Breakfast   cup cooked oatmeal.  1 cup strawberries.  1 cup low-fat milk.  1 oz almonds. Snack  1 cup cucumber slices.  6 oz yogurt (made from low-fat or fat-free milk). Lunch  2 slice whole-wheat bread.  2 oz sliced [place]urkey.  2 tsp mayonnaise.  1 cup blueberries.  1 cup  snap peas. Snack  6 whole-wheat crackers.  1 oz string cheese. Dinner   cup brown rice.  1 cup mixed veggies.  1 tsp olive oil.  3 oz grilled fish. Document Released: 06/23/2005 Document Revised: 09/15/2011 Document Reviewed: 05/09/2011 [facility] Patient Information 2014 [place], [place].       Terressa KoyanagiHannah R. Yaa Donnellan

## 2013-11-03 ENCOUNTER — Encounter: Payer: Self-pay | Admitting: Family Medicine

## 2013-11-03 ENCOUNTER — Ambulatory Visit (INDEPENDENT_AMBULATORY_CARE_PROVIDER_SITE_OTHER): Payer: BC Managed Care – PPO | Admitting: Family Medicine

## 2013-11-03 ENCOUNTER — Encounter: Payer: Self-pay | Admitting: *Deleted

## 2013-11-03 VITALS — BP 118/88 | HR 87 | Temp 98.1°F | Ht 73.0 in | Wt 221.5 lb

## 2013-11-03 DIAGNOSIS — B349 Viral infection, unspecified: Secondary | ICD-10-CM

## 2013-11-03 DIAGNOSIS — K219 Gastro-esophageal reflux disease without esophagitis: Secondary | ICD-10-CM

## 2013-11-03 DIAGNOSIS — B9789 Other viral agents as the cause of diseases classified elsewhere: Secondary | ICD-10-CM

## 2013-11-03 NOTE — Progress Notes (Signed)
No chief complaint on file.   HPI:  Acute visit fo:  1)GERD -just seen recently to establish care and reported was mild and wanted to work on dietary and lifestyle changes -reports: bad acid reflux after eating pizza 4 days ago with acid in throat and heart burn   2)Sick for a few days: -started 2 days ago -symptoms: sore throat, congestion, cough, fever to 101 a few days ago - resolved, loose stools - now resolved after pepto bismol which caused a darker stool, nausea, mouth ulcer -denies: SOB, CP, vomiting, BRBPR  ROS: See pertinent positives and negatives per HPI.  Past Medical History  Diagnosis Date  . Chest pain   . Anxiety   . Depression   . HTN (hypertension)     mild, when under stress from divorce    No past surgical history on file.  Family History  Problem Relation Age of Onset  . Hypertension Father   . ALS Paternal Grandfather     History   Social History  . Marital Status: Married    Spouse Name: N/A    Number of Children: N/A  . Years of Education: N/A   Social History Main Topics  . Smoking status: Former Smoker    Types: Cigarettes    Quit date: 07/19/2011  . Smokeless tobacco: None  . Alcohol Use: 0.5 - 2.0 oz/week    1-4 drink(s) per week  . Drug Use: No  . Sexual Activity: None   Other Topics Concern  . None   Social History Narrative   Work or School: [employer], route Physicist, medicaloperations manager      Home Situation: lives with wife Wesley Fletcher and 4 children and another on the way       Spiritual Beliefs: [place]      Lifestyle: no regular exercise; diet is not good                Current outpatient prescriptions:Alum Hydroxide-Mag Carbonate (GAVISCON PO), Take by mouth., Disp: , Rfl: ;  Bismuth Subsalicylate (PEPTO-BISMOL PO), Take by mouth., Disp: , Rfl: ;  Calcium Carbonate Antacid (TUMS PO), Take by mouth., Disp: , Rfl: ;  ST JOHNS WORT PO, Take by mouth., Disp: , Rfl:   EXAM:  Filed Vitals:   11/03/13 1304  BP: 118/88  Pulse: 87   Temp: 98.1 F (36.7 C)    Body mass index is 29.23 kg/(m^2).  GENERAL: vitals reviewed and listed above, alert, oriented, appears well hydrated and in no acute distress  HEENT: atraumatic, conjunttiva clear, no obvious abnormalities on inspection of external nose and ears, normal appearance of ear canals and TMs, clear nasal congestion, mild post oropharyngeal erythema with PND and aphthous ulcer, no tonsillar edema or exudate, no sinus TTP  NECK: no obvious masses on inspection  LUNGS: clear to auscultation bilaterally, no wheezes, rales or rhonchi, good air movement  CV: HRRR, no peripheral edema  MS: moves all extremities without noticeable abnormality  PSYCH: pleasant and cooperative, no obvious depression or anxiety  ASSESSMENT AND PLAN:  Discussed the following assessment and plan:  GERD (gastroesophageal reflux disease)  Viral illness  -we discussed possible serious and likely etiologies, workup and treatment, treatment risks and return precautions - offered hemocult for dark stool but he feels this is related to the pepto which is likely and declined (advised to follow up immediately if persists or BRBPR) -after this discussion, Wesley Fletcher opted for supportive care, PPI for one month -of course, we advised Wesley Fletcher  to  return or notify a doctor immediately if symptoms worsen or persist or new concerns arise.  -Patient advised to return or notify a doctor immediately if symptoms worsen or persist or new concerns arise.  Patient Instructions  -prilosec 20mg  daily  -plenty of fluids  -loperamide if needed for loose stools  -follow up as needed  Viral Infections A viral infection can be caused by different types of viruses.Most viral infections are not serious and resolve on their own. However, some infections may cause severe symptoms and may lead to further complications. SYMPTOMS Viruses can frequently cause:  Minor sore throat.  Fever  Aches and  pains.  Headaches.  Runny nose.  Different types of rashes.  Watery eyes.  Tiredness.  Cough.  Loss of appetite.  Gastrointestinal infections, resulting in nausea, vomiting, and diarrhea. These symptoms do not respond to antibiotics because the infection is not caused by bacteria. However, you might catch a bacterial infection following the viral infection. This is sometimes called a "superinfection." Symptoms of such a bacterial infection may include:  Worsening sore throat with pus and difficulty swallowing.  Chills and a high or persistent fever.  Severe headache.  Tenderness over the sinuses. HOME CARE INSTRUCTIONS   Only take over-the-counter or prescription medicines for pain, discomfort, diarrhea, or fever as directed by your caregiver.  Drink enough water and fluids to keep your urine clear or pale yellow. Sports drinks can provide valuable electrolytes, sugars, and hydration.  Get plenty of rest and maintain proper nutrition. Soups and broths with crackers or rice are fine. SEEK IMMEDIATE MEDICAL CARE IF:   You have severe headaches, shortness of breath, chest pain, neck pain, or an unusual rash.  You have uncontrolled vomiting, diarrhea, or you are unable to keep down fluids.  You or your child has an oral temperature above 102 F (38.9 C), not controlled by medicine.  Your baby is older than 3 months with a rectal temperature of 102 F (38.9 C) or higher.  Your baby is 393 months old or younger with a rectal temperature of 100.4 F (38 C) or higher. MAKE SURE YOU:   Understand these instructions.  Will watch your condition.  Will get help right away if you are not doing well or get worse. Document Released: 04/02/2005 Document Revised: 09/15/2011 Document Reviewed: 10/28/2010 [facility] Patient Information 2014 [place]ExitCare, MarylandLLC.      Terressa KoyanagiHannah R. Alek Poncedeleon

## 2013-11-03 NOTE — Patient Instructions (Signed)
-  prilosec 20mg  daily  -plenty of fluids  -loperamide if needed for loose stools  -follow up as needed  Viral Infections A viral infection can be caused by different types of viruses.Most viral infections are not serious and resolve on their own. However, some infections may cause severe symptoms and may lead to further complications. SYMPTOMS Viruses can frequently cause:  Minor sore throat.  Fever  Aches and pains.  Headaches.  Runny nose.  Different types of rashes.  Watery eyes.  Tiredness.  Cough.  Loss of appetite.  Gastrointestinal infections, resulting in nausea, vomiting, and diarrhea. These symptoms do not respond to antibiotics because the infection is not caused by bacteria. However, you might catch a bacterial infection following the viral infection. This is sometimes called a "superinfection." Symptoms of such a bacterial infection may include:  Worsening sore throat with pus and difficulty swallowing.  Chills and a high or persistent fever.  Severe headache.  Tenderness over the sinuses. HOME CARE INSTRUCTIONS   Only take over-the-counter or prescription medicines for pain, discomfort, diarrhea, or fever as directed by your caregiver.  Drink enough water and fluids to keep your urine clear or pale yellow. Sports drinks can provide valuable electrolytes, sugars, and hydration.  Get plenty of rest and maintain proper nutrition. Soups and broths with crackers or rice are fine. SEEK IMMEDIATE MEDICAL CARE IF:   You have severe headaches, shortness of breath, chest pain, neck pain, or an unusual rash.  You have uncontrolled vomiting, diarrhea, or you are unable to keep down fluids.  You or your child has an oral temperature above 102 F (38.9 C), not controlled by medicine.  Your baby is older than 3 months with a rectal temperature of 102 F (38.9 C) or higher.  Your baby is 33 months old or younger with a rectal temperature of 100.4 F (38 C)  or higher. MAKE SURE YOU:   Understand these instructions.  Will watch your condition.  Will get help right away if you are not doing well or get worse. Document Released: 04/02/2005 Document Revised: 09/15/2011 Document Reviewed: 10/28/2010 [facility] Patient Information 2014 [place] PointExitCare, MarylandLLC.

## 2013-11-03 NOTE — Progress Notes (Signed)
Pre visit review using our clinic review tool, if applicable. No additional management support is needed unless otherwise documented below in the visit note. 

## 2013-12-28 ENCOUNTER — Encounter: Payer: BC Managed Care – PPO | Admitting: Family Medicine

## 2014-01-26 ENCOUNTER — Encounter: Payer: Self-pay | Admitting: Family Medicine

## 2014-01-26 ENCOUNTER — Ambulatory Visit (INDEPENDENT_AMBULATORY_CARE_PROVIDER_SITE_OTHER): Payer: BC Managed Care – PPO | Admitting: Family Medicine

## 2014-01-26 VITALS — BP 112/88 | HR 78 | Temp 97.9°F | Ht 73.0 in | Wt 223.5 lb

## 2014-01-26 DIAGNOSIS — Z0000 Encounter for general adult medical examination without abnormal findings: Secondary | ICD-10-CM

## 2014-01-26 DIAGNOSIS — F329 Major depressive disorder, single episode, unspecified: Secondary | ICD-10-CM

## 2014-01-26 DIAGNOSIS — F3289 Other specified depressive episodes: Secondary | ICD-10-CM

## 2014-01-26 DIAGNOSIS — K219 Gastro-esophageal reflux disease without esophagitis: Secondary | ICD-10-CM

## 2014-01-26 DIAGNOSIS — Z23 Encounter for immunization: Secondary | ICD-10-CM

## 2014-01-26 DIAGNOSIS — F32A Depression, unspecified: Secondary | ICD-10-CM

## 2014-01-26 DIAGNOSIS — K625 Hemorrhage of anus and rectum: Secondary | ICD-10-CM

## 2014-01-26 LAB — LIPID PANEL
CHOLESTEROL: 210 mg/dL — AB (ref 0–200)
HDL: 36 mg/dL — ABNORMAL LOW (ref >39.00)
LDL Cholesterol: 136 mg/dL — ABNORMAL HIGH (ref 0–99)
NonHDL: 174
Total CHOL/HDL Ratio: 6
Triglycerides: 192 mg/dL — ABNORMAL HIGH (ref 0.0–149.0)
VLDL: 38.4 mg/dL (ref 0.0–40.0)

## 2014-01-26 LAB — CBC WITH DIFFERENTIAL/PLATELET
Basophils Absolute: 0 10*3/uL (ref 0.0–0.1)
Basophils Relative: 0.3 % (ref 0.0–3.0)
EOS PCT: 2.7 % (ref 0.0–5.0)
Eosinophils Absolute: 0.2 10*3/uL (ref 0.0–0.7)
HEMATOCRIT: 44.9 % (ref 39.0–52.0)
Hemoglobin: 15.3 g/dL (ref 13.0–17.0)
LYMPHS ABS: 2.1 10*3/uL (ref 0.7–4.0)
Lymphocytes Relative: 34.1 % (ref 12.0–46.0)
MCHC: 34.1 g/dL (ref 30.0–36.0)
MCV: 80.9 fl (ref 78.0–100.0)
Monocytes Absolute: 0.5 10*3/uL (ref 0.1–1.0)
Monocytes Relative: 7.9 % (ref 3.0–12.0)
Neutro Abs: 3.4 10*3/uL (ref 1.4–7.7)
Neutrophils Relative %: 55 % (ref 43.0–77.0)
Platelets: 277 10*3/uL (ref 150.0–400.0)
RBC: 5.55 Mil/uL (ref 4.22–5.81)
RDW: 13.5 % (ref 11.5–15.5)
WBC: 6.2 10*3/uL (ref 4.0–10.5)

## 2014-01-26 LAB — BASIC METABOLIC PANEL
BUN: 15 mg/dL (ref 6–23)
CALCIUM: 9.5 mg/dL (ref 8.4–10.5)
CHLORIDE: 102 meq/L (ref 96–112)
CO2: 31 mEq/L (ref 19–32)
CREATININE: 1.1 mg/dL (ref 0.4–1.5)
GFR: 77.16 mL/min (ref >60.00)
GLUCOSE: 92 mg/dL (ref 70–99)
Potassium: 4.5 mEq/L (ref 3.5–5.1)
Sodium: 137 mEq/L (ref 135–145)

## 2014-01-26 LAB — HEMOGLOBIN A1C: Hgb A1c MFr Bld: 5.4 % (ref 4.6–6.5)

## 2014-01-26 MED ORDER — PRAVASTATIN SODIUM 40 MG PO TABS: 40.0000 mg | ORAL_TABLET | Freq: Every day | ORAL | Status: DC

## 2014-01-26 NOTE — Progress Notes (Signed)
No chief complaint on file.   HPI:  Annual Exam:  Follow up:  Anxiety and Depression: -takes st. Johns wort, on wellbutrin and xanax in past -reports:  Doing well -denies: no depression, SI, anxiety  GERD/BRBPR: -uses tums prn - took prilosec and resolved, but now daily symptoms - heartburn acid reflux -had significant amounts of painless brbpr for a week coating stools - resolved a week or so ago -taking supplement but this is not helping and worse when lies down -does drink a lot of caffiene -denies: abd pain, vomiting, diarrhea, constipation, hemorrhoids, dysphagia  Hx HTN: -with stress in the past  ROS: denies vision changes, hearing loss, eye pain, CP, SOB, palpitations, fevers, malaise, unexplained weight loss, swelling, diarrhea, dysuria, hematuria, bleeding, depression, syncope  Past Medical History  Diagnosis Date  . Chest pain   . Anxiety   . Depression   . HTN (hypertension)     mild, when under stress from divorce    No past surgical history on file.  Family History  Problem Relation Age of Onset  . Hypertension Father   . ALS Paternal Grandfather     History   Social History  . Marital Status: Married    Spouse Name: N/A    Number of Children: N/A  . Years of Education: N/A   Social History Main Topics  . Smoking status: Former Smoker    Types: Cigarettes    Quit date: 07/19/2011  . Smokeless tobacco: None  . Alcohol Use: 0.5 - 2.0 oz/week    1-4 drink(s) per week  . Drug Use: No  . Sexual Activity: None   Other Topics Concern  . None   Social History Narrative   Work or School: [employer], route Physicist, medicaloperations manager      Home Situation: lives with wife Noreene LarssonJill and 4 children and another on the way       Spiritual Beliefs: [place]      Lifestyle: no regular exercise; diet is not good                Current outpatient prescriptions:Calcium Carbonate Antacid (TUMS PO), Take by mouth., Disp: , Rfl: ;  ST JOHNS WORT PO, Take by mouth.,  Disp: , Rfl:   EXAM:  Filed Vitals:   01/26/14 1111  BP: 112/88  Pulse: 78  Temp: 97.9 F (36.6 C)    Body mass index is 29.49 kg/(m^2).  GENERAL: vitals reviewed and listed above, alert, oriented, appears well hydrated and in no acute distress  HEENT: atraumatic, conjunttiva clear, no obvious abnormalities on inspection of external nose and ears  NECK: no obvious masses on inspection  LUNGS: clear to auscultation bilaterally, no wheezes, rales or rhonchi, good air movement  CV: HRRR, no peripheral edema  ABD: BS+, soft, NTTP  GU/RECTAL: declined  MS: moves all extremities without noticeable abnormality  PSYCH: pleasant and cooperative, no obvious depression or anxiety  ASSESSMENT AND PLAN:  Discussed the following assessment and plan:  Visit for preventive health examination - Plan: Lipid Panel, Hemoglobin A1c, CBC with Differential, Basic metabolic panel -all level A and B USPSTF recs discussed -FASTING labs today -Tdap with baby on the way and does not know when had this last  BRBPR (bright red blood per rectum) - Plan: CBC with Differential Gastroesophageal reflux disease, esophagitis presence not specified -we discussed possible serious and likely etiologies, workup and treatment, treatment risks and return precautions -after this discussion, Lyda JesterCurtis opted for daily PPI and referral to GI -follow  up advised as needed -of course, we advised Brentyn  to return or notify a doctor immediately if symptoms worsen or persist or new concerns arise.  Depression -stable, discussed potential for side effects with SJW and possible taper off of this - he reports he has been on this a long time and does not think is a factor in his health  -Patient advised to return or notify a doctor immediately if symptoms worsen or persist or new concerns arise.  Patient Instructions  Prilosec 20 mg daily and work on acid reflux diet  We placed a referral for you as discussed to the  gastroenterologist. It usually takes about 1-2 weeks to process and schedule this referral. If you have not heard from [place] regarding this appointment in 2 weeks please contact our office.  Follow up with me as needed after seeing gastroenterologist or before if new or worsening symptoms  -We have ordered labs or studies at this visit. It can take up to 1-2 weeks for results and processing. We will contact you with instructions IF your results are abnormal. Normal results will be released to your [facility]. If you have not heard from [place] or can not find your results in [facility] in 2 weeks please contact our office.  -PLEASE SIGN UP FOR MYCHART TODAY   We recommend the following healthy lifestyle measures: - eat a healthy diet consisting of lots of vegetables, fruits, beans, nuts, seeds, healthy meats such as white chicken and fish and whole grains.  - avoid fried foods, fast food, processed foods, sodas, red meet and other fattening foods.  - get a least 150 minutes of aerobic exercise per week.          Kriste Basque R.

## 2014-01-26 NOTE — Progress Notes (Signed)
Pre visit review using our clinic review tool, if applicable. No additional management support is needed unless otherwise documented below in the visit note. 

## 2014-01-26 NOTE — Patient Instructions (Signed)
Prilosec 20 mg daily and work on acid reflux diet  We placed a referral for you as discussed to the gastroenterologist. It usually takes about 1-2 weeks to process and schedule this referral. If you have not heard from us regarding this appointment in 2 weeks please contact our office.  Follow up with me as needed after seeing gastroenterologist or before if new or worsening symptoms  -We have ordered labs or studies at this visit. It can take up to 1-2 weeks for results and processing. We will contact you with instructions IF your results are abnormal. Normal results will be released to your [facility]. If you have not heard from us or can not find your results in [facility]ART in 2 weeks please contact our office.  -PLEASE SIGN UP FOR MYCHART TODAY   We recommend the following healthy lifestyle measures: - eat a healthy diet consisting of lots of vegetables, fruits, beans, nuts, seeds, healthy meats such as white chicken and fish and whole grains.  - avoid fried foods, fast food, processed foods, sodas, red meet and other fattening foods.  - get a least 150 minutes of aerobic exercise per week.

## 2014-01-26 NOTE — Addendum Note (Signed)
Addended by: Johnella MoloneyFUNDERBURK, Nakya Weyand A on: 01/26/2014 11:40 AM   Modules accepted: Orders

## 2014-01-26 NOTE — Addendum Note (Signed)
Addended by: Johnella MoloneyFUNDERBURK, Lucresha Dismuke A on: 01/26/2014 04:03 PM   Modules accepted: Orders

## 2014-07-26 ENCOUNTER — Ambulatory Visit (INDEPENDENT_AMBULATORY_CARE_PROVIDER_SITE_OTHER): Payer: BLUE CROSS/BLUE SHIELD | Admitting: Family Medicine

## 2014-07-26 ENCOUNTER — Encounter: Payer: Self-pay | Admitting: Family Medicine

## 2014-07-26 VITALS — BP 112/80 | HR 85 | Temp 98.0°F | Ht 73.0 in | Wt 222.2 lb

## 2014-07-26 DIAGNOSIS — E785 Hyperlipidemia, unspecified: Secondary | ICD-10-CM

## 2014-07-26 DIAGNOSIS — K219 Gastro-esophageal reflux disease without esophagitis: Secondary | ICD-10-CM

## 2014-07-26 DIAGNOSIS — F419 Anxiety disorder, unspecified: Secondary | ICD-10-CM

## 2014-07-26 DIAGNOSIS — F418 Other specified anxiety disorders: Secondary | ICD-10-CM

## 2014-07-26 DIAGNOSIS — F329 Major depressive disorder, single episode, unspecified: Secondary | ICD-10-CM

## 2014-07-26 NOTE — Progress Notes (Signed)
Pre visit review using our clinic review tool, if applicable. No additional management support is needed unless otherwise documented below in the visit note. 

## 2014-07-26 NOTE — Patient Instructions (Signed)
BEFORE YOU LEAVE: -schedule fasting lab appointment  Call me if you have any more bowel issues and/or want to see the gastroenterologist  We recommend the following healthy lifestyle measures: - eat a healthy diet consisting of lots of vegetables, fruits, beans, nuts, seeds, healthy meats such as white chicken and fish and whole grains.  - avoid fried foods, fast food, processed foods, sodas, red meet and other fattening foods.  - get a least 150 minutes of aerobic exercise per week.   For focus issues please consider counseling, exercise, adequate sleep - consider neuropsychiatric testing for ADD - let us know if you want to do this.

## 2014-07-26 NOTE — Progress Notes (Signed)
HPI:  HLD: -started pravastatin 01/2014 -reports: did not tolerate the statin - cause him to feel weird and he stopped it -working on diet substantially and has lost weight intentionally, no regular exercise  Anxiety and Depression: -takes st. Johns wort - we have discussed risks with this, on wellbutrin and xanax in past -reports: Doing well -denies: no depression, SI, anxiety -minor focus issues, not interfering with function, had ADD as a child and not on medication  GERD/BRBPR: -started prilosec and advised referral to GI last visit -reports he went on a low calorie diet and has not had any more symptoms in 6 months so he decided he did not have to see the GI doctor - thinks was hemorrhoids or tear from straining -denies: abd pain, vomiting, diarrhea, constipation, melena, BRBPR, GERD, hemorrhoids, dysphagia  Hx HTN: -with stress in the past  ROS: See pertinent positives and negatives per HPI.  Past Medical History  Diagnosis Date  . Chest pain   . Anxiety   . Depression   . HTN (hypertension)     mild, when under stress from divorce    No past surgical history on file.  Family History  Problem Relation Age of Onset  . Hypertension Father   . ALS Paternal Grandfather     History   Social History  . Marital Status: Married    Spouse Name: N/A    Number of Children: N/A  . Years of Education: N/A   Social History Main Topics  . Smoking status: Former Smoker    Types: Cigarettes    Quit date: 07/19/2011  . Smokeless tobacco: None  . Alcohol Use: 0.5 - 2.0 oz/week    1-4 drink(s) per week  . Drug Use: No  . Sexual Activity: None   Other Topics Concern  . None   Social History Narrative   Work or School: Cytogeneticist, route Physicist, medical Situation: lives with wife Noreene Larsson and 4 children and another on the way       Spiritual Beliefs: [place]      Lifestyle: no regular exercise; diet is not good                 Current outpatient  prescriptions:  .  Calcium Carbonate Antacid (TUMS PO), Take by mouth., Disp: , Rfl:  .  ST JOHNS WORT PO, Take by mouth., Disp: , Rfl:   EXAM:  Filed Vitals:   07/26/14 1027  BP: 112/80  Pulse: 85  Temp: 98 F (36.7 C)    Body mass index is 29.32 kg/(m^2).  GENERAL: vitals reviewed and listed above, alert, oriented, appears well hydrated and in no acute distress  HEENT: atraumatic, conjunttiva clear, no obvious abnormalities on inspection of external nose and ears  NECK: no obvious masses on inspection  LUNGS: clear to auscultation bilaterally, no wheezes, rales or rhonchi, good air movement  CV: HRRR, no peripheral edema  MS: moves all extremities without noticeable abnormality  PSYCH: pleasant and cooperative, no obvious depression or anxiety  ASSESSMENT AND PLAN:  Discussed the following assessment and plan:  Gastroesophageal reflux disease, esophagitis presence not specified  Hyperlipemia - Plan: Lipid Panel  Anxiety and depression  -Patient advised to return or notify a doctor immediately if symptoms worsen or persist or new concerns arise.  Patient Instructions  BEFORE YOU LEAVE: -schedule fasting lab appointment  Call me if you have any more bowel issues and/or want to see the gastroenterologist  We recommend  the following healthy lifestyle measures: - eat a healthy diet consisting of lots of vegetables, fruits, beans, nuts, seeds, healthy meats such as white chicken and fish and whole grains.  - avoid fried foods, fast food, processed foods, sodas, red meet and other fattening foods.  - get a least 150 minutes of aerobic exercise per week.   For focus issues please consider counseling, exercise, adequate sleep - consider neuropsychiatric testing for ADD - let us know if you want to do this.      Kriste BasqueKIM, Kinslei Labine R.

## 2014-07-27 ENCOUNTER — Ambulatory Visit: Payer: BC Managed Care – PPO | Admitting: Family Medicine

## 2014-08-02 ENCOUNTER — Other Ambulatory Visit: Payer: BLUE CROSS/BLUE SHIELD

## 2017-03-26 ENCOUNTER — Encounter: Payer: Self-pay | Admitting: Family Medicine

## 2017-07-16 ENCOUNTER — Encounter: Payer: Self-pay | Admitting: Family Medicine

## 2018-01-18 NOTE — Progress Notes (Signed)
HPI:  Using dictation device. Unfortunately this device frequently misinterprets words/phrases.  Wesley Fletcher is a pleasant 42 yo with a PMH significant for HTN, Depression, Anxiety, GERD and obesity here to re-establish care. He primarily is concerned about a new rash. Started a few days ago after staying in a hotel. Itchy bumps primarily on fingers, webs of fingers, arms. Very concerned this could be scabies. Feels well o/w.  Had acid reflux from time to time with dietary indiscretion. Uses prilosec prn. No dysphagia. Has gained some weight. Gets exercise at the gym 1x per week and tries to eat healthy, but not always. Wants to get preventive/CPE labs.  ROS: See pertinent positives and negatives per HPI.  Past Medical History:  Diagnosis Date  . Anxiety   . Chest pain   . Depression   . HTN (hypertension)    mild, when under stress from divorce    History reviewed. No pertinent surgical history.  Family History  Problem Relation Age of Onset  . Hypertension Father   . ALS Paternal Grandfather     SOCIAL HX: see hpi   Current Outpatient Medications:  .  Calcium Carbonate Antacid (TUMS PO), Take by mouth., Disp: , Rfl:  .  Omeprazole (PRILOSEC PO), Take by mouth daily., Disp: , Rfl:  .  ST JOHNS WORT PO, Take by mouth., Disp: , Rfl:  .  permethrin (ELIMITE) 5 % cream, Apply 1 application topically once for 1 dose. May repeat in 2 weeks if needed, Disp: 1 g, Rfl: 0  EXAM:  Vitals:   01/19/18 0731  BP: 102/80  Pulse: 89  Temp: 98.6 F (37 C)    Body mass index is 31.66 kg/m.  GENERAL: vitals reviewed and listed above, alert, oriented, appears well hydrated and in no acute distress  HEENT: atraumatic, conjunttiva clear, no obvious abnormalities on inspection of external nose and ears  NECK: no obvious masses on inspection  LUNGS: clear to auscultation bilaterally, no wheezes, rales or rhonchi, good air movement  CV: HRRR, no peripheral edema  MS: moves all  extremities without noticeable abnormality  SKIN: small erythematous papules on the hands, finger webs, elbows and on arms in streaky and patch distribution, several excoriations, no crusting or signs of secondary bacterial infection  PSYCH: pleasant and cooperative, no obvious depression or anxiety  ASSESSMENT AND PLAN:  Discussed the following assessment and plan:  Scabies  Gastroesophageal reflux disease, esophagitis presence not specified  Obesity (BMI 30-39.9)  Encounter to establish care  -discussed potential etiologies skin rash, scabies high on the list and opted to treat with permethrin and home iradication. Follow up if persists or other concerns. -prn ppi for acid reflux and discussed potential complications and lifestyle treatments. -advised healthy diet and regular exercise for wt reduction -advised yearly follow up for CPE and prn -Patient advised to return or notify a doctor immediately if symptoms worsen or persist or new concerns arise.  Patient Instructions  BEFORE YOU LEAVE: -follow up: schedule lab appointment for fasting labs in about 2-3 weeks; CPE in 1 year  I hope you are feeling better soon! Seek care promptly if your symptoms worsen, new concerns arise or you are not improving with treatment.    Scabies, Adult Scabies is a skin condition that happens when very small insects get under the skin (infestation). This causes a rash and severe itchiness. Scabies can spread from person to person (is contagious). If you get scabies, it is common for others in your household to  get scabies too. With proper treatment, symptoms usually go away in 2-4 weeks. Scabies usually does not cause lasting problems. What are the causes? This condition is caused by mites (Sarcoptes scabiei, or human itch mites) that can only be seen with a microscope. The mites get into the top layer of skin and lay eggs. Scabies can spread from person to person through:  Close contact with a  person who has scabies.  Contact with infested items, such as towels, bedding, or clothing.  What increases the risk? This condition is more likely to develop in:  People who live in nursing homes and other extended-care facilities.  People who have sexual contact with a partner who has scabies.  Young children who attend child care facilities.  People who care for others who are at increased risk for scabies.  What are the signs or symptoms? Symptoms of this condition may include:  Severe itchiness. This is often worse at night.  A rash that includes tiny red bumps or blisters. The rash commonly occurs on the wrist, elbow, armpit, fingers, waist, groin, or buttocks. Bumps may form a line (burrow) in some areas.  Skin irritation. This can include scaly patches or sores.  How is this diagnosed? This condition is diagnosed with a physical exam. Your health care provider will look closely at your skin. In some cases, your health care provider may take a sample of your affected skin (skin scraping) and have it examined under a microscope. How is this treated? This condition may be treated with:  Medicated cream or lotion that kills the mites. This is spread on the entire body and left on for several hours. Usually, one treatment with medicated cream or lotion is enough to kill all of the mites. In severe cases, the treatment may be repeated.  Medicated cream that relieves itching.  Medicines that help to relieve itching.  Medicines that kill the mites. This treatment is rarely used.  Follow these instructions at home:  Medicines  Take or apply over-the-counter and prescription medicines as told by your health care provider.  Apply medicated cream or lotion as told by your health care provider.  Do not wash off the medicated cream or lotion until the necessary amount of time has passed. Skin Care  Avoid scratching your affected skin.  Keep your fingernails closely trimmed  to reduce injury from scratching.  Take cool baths or apply cool washcloths to help reduce itching. General instructions  Clean all items that you recently had contact with, including bedding, clothing, and furniture. Do this on the same day that your treatment starts. ? Use hot water when you wash items. ? Place unwashable items into closed, airtight plastic bags for at least 3 days. The mites cannot live for more than 3 days away from human skin. ? Vacuum furniture and mattresses that you use.  Make sure that other people who may have been infested are examined by a health care provider. These include members of your household and anyone who may have had contact with infested items.  Keep all follow-up visits as told by your health care provider. This is important. Contact a health care provider if:  You have itching that does not go away after 4 weeks of treatment.  You continue to develop new bumps or burrows.  You have redness, swelling, or pain in your rash area after treatment.  You have fluid, blood, or pus coming from your rash. This information is not intended to replace advice  given to you by your health care provider. Make sure you discuss any questions you have with your health care provider. Document Released: 03/14/2015 Document Revised: 11/29/2015 Document Reviewed: 01/23/2015 Elsevier Interactive Patient Education  2018 [employer].     Terressa Koyanagi, DO

## 2018-01-19 ENCOUNTER — Ambulatory Visit: Payer: BC Managed Care – PPO | Admitting: Family Medicine

## 2018-01-19 ENCOUNTER — Encounter: Payer: Self-pay | Admitting: Family Medicine

## 2018-01-19 VITALS — BP 102/80 | HR 89 | Temp 98.6°F | Ht 73.0 in | Wt 240.0 lb

## 2018-01-19 DIAGNOSIS — K219 Gastro-esophageal reflux disease without esophagitis: Secondary | ICD-10-CM | POA: Diagnosis not present

## 2018-01-19 DIAGNOSIS — Z299 Encounter for prophylactic measures, unspecified: Secondary | ICD-10-CM | POA: Diagnosis not present

## 2018-01-19 DIAGNOSIS — E669 Obesity, unspecified: Secondary | ICD-10-CM

## 2018-01-19 DIAGNOSIS — Z7689 Persons encountering health services in other specified circumstances: Secondary | ICD-10-CM

## 2018-01-19 DIAGNOSIS — B86 Scabies: Secondary | ICD-10-CM

## 2018-01-19 MED ORDER — PERMETHRIN 5 % EX CREA: 1.0000 "application " | TOPICAL_CREAM | Freq: Once | CUTANEOUS | 0 refills | Status: AC

## 2018-01-19 NOTE — Patient Instructions (Signed)
BEFORE YOU LEAVE: -follow up: schedule lab appointment for fasting labs in about 2-3 weeks; CPE in 1 year  I hope you are feeling better soon! Seek care promptly if your symptoms worsen, new concerns arise or you are not improving with treatment.    Scabies, Adult Scabies is a skin condition that happens when very small insects get under the skin (infestation). This causes a rash and severe itchiness. Scabies can spread from person to person (is contagious). If you get scabies, it is common for others in your household to get scabies too. With proper treatment, symptoms usually go away in 2-4 weeks. Scabies usually does not cause lasting problems. What are the causes? This condition is caused by mites (Sarcoptes scabiei, or human itch mites) that can only be seen with a microscope. The mites get into the top layer of skin and lay eggs. Scabies can spread from person to person through:  Close contact with a person who has scabies.  Contact with infested items, such as towels, bedding, or clothing.  What increases the risk? This condition is more likely to develop in:  People who live in nursing homes and other extended-care facilities.  People who have sexual contact with a partner who has scabies.  Young children who attend child care facilities.  People who care for others who are at increased risk for scabies.  What are the signs or symptoms? Symptoms of this condition may include:  Severe itchiness. This is often worse at night.  A rash that includes tiny red bumps or blisters. The rash commonly occurs on the wrist, elbow, armpit, fingers, waist, groin, or buttocks. Bumps may form a line (burrow) in some areas.  Skin irritation. This can include scaly patches or sores.  How is this diagnosed? This condition is diagnosed with a physical exam. Your health care provider will look closely at your skin. In some cases, your health care provider may take a sample of your affected  skin (skin scraping) and have it examined under a microscope. How is this treated? This condition may be treated with:  Medicated cream or lotion that kills the mites. This is spread on the entire body and left on for several hours. Usually, one treatment with medicated cream or lotion is enough to kill all of the mites. In severe cases, the treatment may be repeated.  Medicated cream that relieves itching.  Medicines that help to relieve itching.  Medicines that kill the mites. This treatment is rarely used.  Follow these instructions at home:  Medicines  Take or apply over-the-counter and prescription medicines as told by your health care provider.  Apply medicated cream or lotion as told by your health care provider.  Do not wash off the medicated cream or lotion until the necessary amount of time has passed. Skin Care  Avoid scratching your affected skin.  Keep your fingernails closely trimmed to reduce injury from scratching.  Take cool baths or apply cool washcloths to help reduce itching. General instructions  Clean all items that you recently had contact with, including bedding, clothing, and furniture. Do this on the same day that your treatment starts. ? Use hot water when you wash items. ? Place unwashable items into closed, airtight plastic bags for at least 3 days. The mites cannot live for more than 3 days away from human skin. ? Vacuum furniture and mattresses that you use.  Make sure that other people who may have been infested are examined by a health care  provider. These include members of your household and anyone who may have had contact with infested items.  Keep all follow-up visits as told by your health care provider. This is important. Contact a health care provider if:  You have itching that does not go away after 4 weeks of treatment.  You continue to develop new bumps or burrows.  You have redness, swelling, or pain in your rash area after  treatment.  You have fluid, blood, or pus coming from your rash. This information is not intended to replace advice given to you by your health care provider. Make sure you discuss any questions you have with your health care provider. Document Released: 03/14/2015 Document Revised: 11/29/2015 Document Reviewed: 01/23/2015 Elsevier Interactive Patient Education  [employer].

## 2018-02-09 ENCOUNTER — Telehealth: Payer: Self-pay | Admitting: Family Medicine

## 2018-02-09 ENCOUNTER — Other Ambulatory Visit (INDEPENDENT_AMBULATORY_CARE_PROVIDER_SITE_OTHER): Payer: BC Managed Care – PPO

## 2018-02-09 DIAGNOSIS — E669 Obesity, unspecified: Secondary | ICD-10-CM

## 2018-02-09 DIAGNOSIS — Z299 Encounter for prophylactic measures, unspecified: Secondary | ICD-10-CM

## 2018-02-09 LAB — LIPID PANEL
CHOL/HDL RATIO: 6
Cholesterol: 235 mg/dL — ABNORMAL HIGH (ref 0–200)
HDL: 40.4 mg/dL (ref >39.00)
NONHDL: 194.7
TRIGLYCERIDES: 347 mg/dL — AB (ref 0.0–149.0)
VLDL: 69.4 mg/dL — ABNORMAL HIGH (ref 0.0–40.0)

## 2018-02-09 LAB — LDL CHOLESTEROL, DIRECT: Direct LDL: 147 mg/dL

## 2018-02-09 LAB — HEMOGLOBIN A1C: HEMOGLOBIN A1C: 5.8 % (ref 4.6–6.5)

## 2018-02-09 NOTE — Telephone Encounter (Signed)
Please call pt. Ok to refill once if thinks got another infection. But, advise derm eval if persistent symptoms not responding to treatment.

## 2018-02-09 NOTE — Telephone Encounter (Signed)
Patient needs refill on Rx  permethrin (ELIMITE) 5 % cream    Sent to:   [facility]  [address] [place]  [phone]

## 2018-02-10 LAB — HIV ANTIBODY (ROUTINE TESTING W REFLEX): HIV: NONREACTIVE

## 2018-02-11 MED ORDER — PERMETHRIN 5 % EX CREA: 1.0000 "application " | TOPICAL_CREAM | Freq: Once | CUTANEOUS | 0 refills | Status: AC

## 2018-02-11 NOTE — Telephone Encounter (Signed)
I called the pt and informed him of the message below.  Patient stated he does feel a repeat infection has occurred and is aware the Rx was sent to his pharmacy.

## 2018-02-11 NOTE — Addendum Note (Signed)
Addended by: Sallee LangeFUNDERBURK, JO A on: 02/11/2018 10:30 AM   Modules accepted: Orders

## 2018-02-16 ENCOUNTER — Telehealth: Payer: Self-pay | Admitting: *Deleted

## 2018-02-16 NOTE — Telephone Encounter (Signed)
Spoke with patient.  He did receive his lab results with the result note. He has changed his diet.  He is trying no sugar, low carb, and lean meats.

## 2018-02-16 NOTE — Telephone Encounter (Signed)
-----   Message from Terressa KoyanagiHannah R Kim, DO sent at 02/10/2018  8:35 PM EDT ----- Gwenyth AllegraHi Elene Downum,  It looks like you may have been handling my result basket for [facility]. Thank you. Do you mind giving him a call about the abnormal results? I usually release normal or relatively normal results via mychart, but for abnormal results with instructions I prefer that the patient receive a call as that is the instructions they are provided with per our lab instruction smartphrase protocol. Thank you so much!  Dr. Selena BattenKim

## 2018-02-17 NOTE — Telephone Encounter (Signed)
Patient has made a follow up office visit.

## 2018-05-25 ENCOUNTER — Ambulatory Visit: Payer: Self-pay | Admitting: Family Medicine

## 2018-05-25 ENCOUNTER — Other Ambulatory Visit: Payer: Self-pay

## 2018-06-08 ENCOUNTER — Ambulatory Visit: Payer: BC Managed Care – PPO | Admitting: Family Medicine

## 2018-06-08 ENCOUNTER — Encounter: Payer: Self-pay | Admitting: Family Medicine

## 2018-06-08 VITALS — BP 110/80 | HR 72 | Temp 97.6°F | Ht 73.0 in | Wt 206.8 lb

## 2018-06-08 DIAGNOSIS — E785 Hyperlipidemia, unspecified: Secondary | ICD-10-CM

## 2018-06-08 DIAGNOSIS — R739 Hyperglycemia, unspecified: Secondary | ICD-10-CM

## 2018-06-08 DIAGNOSIS — E663 Overweight: Secondary | ICD-10-CM

## 2018-06-08 LAB — LIPID PANEL
CHOL/HDL RATIO: 6
Cholesterol: 243 mg/dL — ABNORMAL HIGH (ref 0–200)
HDL: 43.6 mg/dL (ref >39.00)
LDL Cholesterol: 173 mg/dL — ABNORMAL HIGH (ref 0–99)
NONHDL: 199.34
TRIGLYCERIDES: 133 mg/dL (ref 0.0–149.0)
VLDL: 26.6 mg/dL (ref 0.0–40.0)

## 2018-06-08 LAB — HEMOGLOBIN A1C: HEMOGLOBIN A1C: 5.7 % (ref 4.6–6.5)

## 2018-06-08 NOTE — Progress Notes (Signed)
HPI:  Using dictation device. Unfortunately this device frequently misinterprets words/phrases.  Wesley Fletcher is a pleasant 42 year old here for follow-up of hyperlipidemia, mild prediabetes and obesity.  Last labs were done in August 2019.  A healthy low sugar diet and regular aerobic exercise were advised.  Today reports eating much better, low sugar, low carb, lean proteins, decreased red meat, increased veggies. Not as good about exercise recently - hard to find time but plans to start back into exercising. Feels much better, increased energy.  He is due for repeat on the labs and his flu vaccine. Declines flu vaccine. Fasting for labs.  Wt 240 7/19 --> 206 12/19  ROS: See pertinent positives and negatives per HPI.  Past Medical History:  Diagnosis Date  . Anxiety   . Chest pain   . Depression   . HTN (hypertension)    mild, when under stress from divorce    History reviewed. No pertinent surgical history.  Family History  Problem Relation Age of Onset  . Hypertension Father   . ALS Paternal Grandfather     SOCIAL HX: see hpi   Current Outpatient Medications:  .  ST JOHNS WORT PO, Take by mouth., Disp: , Rfl:   EXAM:  Vitals:   06/08/18 0841  BP: 116/80  Pulse: 72  Temp: 97.6 F (36.4 C)    Body mass index is 27.28 kg/m.  GENERAL: vitals reviewed and listed above, alert, oriented, appears well hydrated and in no acute distress  HEENT: atraumatic, conjunttiva clear, no obvious abnormalities on inspection of external nose and ears  NECK: no obvious masses on inspection  LUNGS: clear to auscultation bilaterally, no wheezes, rales or rhonchi, good air movement  CV: HRRR, no peripheral edema  MS: moves all extremities without noticeable abnormality  PSYCH: pleasant and cooperative, no obvious depression or anxiety  ASSESSMENT AND PLAN:  Discussed the following assessment and plan:  Hyperlipidemia, unspecified hyperlipidemia type - Plan: Lipid  panel  Hyperglycemia - Plan: Hemoglobin A1c  Overweight (BMI 25.0-29.9)  -congratulated on changes and success - recommended continued healthy diet and regular exercise -labs today -recheck bp before leaving and treat with continued lifestyle changes and monitor at follow up  Patient Instructions  BEFORE YOU LEAVE: -recheck BP  -labs -follow up: 4-6 months  We have ordered labs or studies at this visit. It can take up to 1-2 weeks for results and processing. IF results require follow up or explanation, we will call you with instructions. Clinically stable results will be released to your [facility]. If you have not heard from us or cannot find your results in [facility] in 2 weeks please contact our office at [phone].  If you are not yet signed up for [facility], please consider signing up.   Congratulations on the lifestyle changes and weight reduction! Keep up a healthy low sugar diet and regular exercise!   We recommend the following healthy lifestyle for LIFE: 1) Small portions. But, make sure to get regular (at least 3 per day), healthy meals and small healthy snacks if needed.  2) Eat a healthy clean diet.   TRY TO EAT: -at least 5-7 servings of low sugar, colorful, and nutrient rich vegetables per day (not corn, potatoes or bananas.) -berries are the best choice if you wish to eat fruit (only eat small amounts if trying to reduce weight)  -lean meets (fish, white meat of chicken or [place]) -vegan proteins for some meals - beans or tofu, whole grains, nuts and seeds -  Replace bad fats with good fats - good fats include: fish, nuts and seeds, canola oil, olive oil -small amounts of low fat or non fat dairy -small amounts of100 % whole grains - check the lables -drink plenty of water  AVOID: -SUGAR, sweets, anything with added sugar, corn syrup or sweeteners - must read labels as even foods advertised as "healthy" often are loaded with sugar -if you must have a sweetener, small  amounts of stevia may be best -sweetened beverages and artificially sweetened beverages -simple starches (rice, bread, potatoes, pasta, chips, etc - small amounts of 100% whole grains are ok) -red meat, pork, butter -fried foods, fast food, processed food, excessive dairy, eggs and coconut.  3)Get at least 150 minutes of sweaty aerobic exercise per week.  4)Reduce stress - consider counseling, meditation and relaxation to balance other aspects of your life.         Terressa Koyanagi, DO

## 2018-06-08 NOTE — Patient Instructions (Signed)
BEFORE YOU LEAVE: -recheck BP  -labs -follow up: 4-6 months  We have ordered labs or studies at this visit. It can take up to 1-2 weeks for results and processing. IF results require follow up or explanation, we will call you with instructions. Clinically stable results will be released to your [facility]. If you have not heard from us or cannot find your results in [facility] in 2 weeks please contact our office at [phone].  If you are not yet signed up for [facility], please consider signing up.   Congratulations on the lifestyle changes and weight reduction! Keep up a healthy low sugar diet and regular exercise!   We recommend the following healthy lifestyle for LIFE: 1) Small portions. But, make sure to get regular (at least 3 per day), healthy meals and small healthy snacks if needed.  2) Eat a healthy clean diet.   TRY TO EAT: -at least 5-7 servings of low sugar, colorful, and nutrient rich vegetables per day (not corn, potatoes or bananas.) -berries are the best choice if you wish to eat fruit (only eat small amounts if trying to reduce weight)  -lean meets (fish, white meat of chicken or [place]) -vegan proteins for some meals - beans or tofu, whole grains, nuts and seeds -Replace bad fats with good fats - good fats include: fish, nuts and seeds, canola oil, olive oil -small amounts of low fat or non fat dairy -small amounts of100 % whole grains - check the lables -drink plenty of water  AVOID: -SUGAR, sweets, anything with added sugar, corn syrup or sweeteners - must read labels as even foods advertised as "healthy" often are loaded with sugar -if you must have a sweetener, small amounts of stevia may be best -sweetened beverages and artificially sweetened beverages -simple starches (rice, bread, potatoes, pasta, chips, etc - small amounts of 100% whole grains are ok) -red meat, pork, butter -fried foods, fast food, processed food, excessive dairy, eggs and coconut.  3)Get at  least 150 minutes of sweaty aerobic exercise per week.  4)Reduce stress - consider counseling, meditation and relaxation to balance other aspects of your life.

## 2018-06-11 NOTE — Addendum Note (Signed)
Addended by: Johnella MoloneyFUNDERBURK, Kinneth Fujiwara A on: 06/11/2018 02:52 PM   Modules accepted: Orders

## 2018-08-07 NOTE — Progress Notes (Deleted)
HPI:  Using dictation device. Unfortunately this device frequently misinterprets words/phrases.  Here for CPE: Due for obesity follow up.  -Concerns and/or follow up today:   Has been working really hard on lifestyle changes for obesity and hyperlipidemia. Making impressive progress with weight reduction. Weight 240 7/19 --> 206 12/19.  -Diet: variety of foods, balance and well rounded, larger portion sizes -Exercise: no regular exercise -Diabetes and Dyslipidemia Screening: due for recheck lipids in 2-3 months. -Hx of HTN: no -Vaccines: UTD -sexual activity: yes, male partner, no new partners -wants STI testing, Hep C screening (if born 58-1963): no -FH colon or prstate ca: see FH Last colon cancer screening: n/a  Last prostate ca screening: n/a -Alcohol, Tobacco, drug use: see social history  Review of Systems - no fevers, unintentional weight loss, vision loss, hearing loss, chest pain, sob, hemoptysis, melena, hematochezia, hematuria, genital discharge, changing or concerning skin lesions, bleeding, bruising, loc, thoughts of self harm or SI  Past Medical History:  Diagnosis Date  . Anxiety   . Chest pain   . Depression   . HTN (hypertension)    mild, when under stress from divorce    No past surgical history on file.  Family History  Problem Relation Age of Onset  . Hypertension Father   . ALS Paternal Grandfather     Social History   Socioeconomic History  . Marital status: Married    Spouse name: Not on file  . Number of children: Not on file  . Years of education: Not on file  . Highest education level: Not on file  Occupational History  . Not on file  Social Needs  . Financial resource strain: Not on file  . Food insecurity:    Worry: Not on file    Inability: Not on file  . Transportation needs:    Medical: Not on file    Non-medical: Not on file  Tobacco Use  . Smoking status: Former Smoker    Types: Cigarettes    Last attempt to quit:  07/19/2011    Years since quitting: 7.0  . Smokeless tobacco: Former Engineer, water and Sexual Activity  . Alcohol use: Yes    Alcohol/week: 1.0 - 4.0 standard drinks    Types: 1 - 4 Standard drinks or equivalent per week  . Drug use: No  . Sexual activity: Not on file  Lifestyle  . Physical activity:    Days per week: Not on file    Minutes per session: Not on file  . Stress: Not on file  Relationships  . Social connections:    Talks on phone: Not on file    Gets together: Not on file    Attends religious service: Not on file    Active member of club or organization: Not on file    Attends meetings of clubs or organizations: Not on file    Relationship status: Not on file  Other Topics Concern  . Not on file  Social History Narrative   Work or School: Cytogeneticist, route Nature conservation officer      Home Situation: lives with wife Noreene Larsson and 4 children and another on the way       Spiritual Beliefs: [place]      Lifestyle: no regular exercise; diet is not good                 Current Outpatient Medications:  .  ST JOHNS WORT PO, Take by mouth., Disp: , Rfl:   EXAM:  There were no vitals filed for this visit.  Estimated body mass index is 27.28 kg/m as calculated from the following:   Height as of 06/08/18: 6\' 1"  (1.854 m).   Weight as of 06/08/18: 206 lb 12.8 oz (93.8 kg).  GENERAL: vitals reviewed and listed below, alert, oriented, appears well hydrated and in no acute distress  HEENT: head atraumatic, PERRLA, normal appearance of eyes, ears, nose and mouth. moist mucus membranes.  NECK: supple, no masses or lymphadenopathy  LUNGS: clear to auscultation bilaterally, no rales, rhonchi or wheeze  CV: HRRR, no peripheral edema or cyanosis, normal pedal pulses  ABDOMEN: bowel sounds normal, soft, non tender to palpation, no masses, no rebound or guarding  GU: normal appearance of external genitalia - no lesions or masses, hernia exam normal. ***  RECTAL:  refused  SKIN: no rash or abnormal lesions  MS: normal gait, moves all extremities normally  NEURO: normal gait, speech and thought processing grossly intact, muscle tone grossly intact throughout  PSYCH: normal affect, pleasant and cooperative  ASSESSMENT AND PLAN:  Discussed the following assessment and plan:  PREVENTIVE EXAM: -Discussed and advised all [place] preventive services health task force level A and B recommendations for age, sex and risks. -Advised at least 150 minutes of exercise per week and a healthy diet with avoidance of (less then 1 serving per week) processed foods, white starches, red meat, fast foods and sweets and consisting of: * 5-9 servings of fresh fruits and vegetables (not corn or potatoes) *nuts and seeds, beans *olives and olive oil *lean meats such as fish and white chicken  *whole grains -labs, studies and vaccines per orders this encounter  There are no diagnoses linked to this encounter. ***  There are no Patient Instructions on file for this visit.  No follow-ups on file.   Terressa Koyanagi, DO

## 2018-08-09 ENCOUNTER — Encounter: Payer: BC Managed Care – PPO | Admitting: Family Medicine

## 2018-08-09 DIAGNOSIS — Z0289 Encounter for other administrative examinations: Secondary | ICD-10-CM

## 2018-12-06 ENCOUNTER — Encounter: Payer: Self-pay | Admitting: Family Medicine

## 2018-12-06 ENCOUNTER — Ambulatory Visit (INDEPENDENT_AMBULATORY_CARE_PROVIDER_SITE_OTHER): Payer: BC Managed Care – PPO | Admitting: Family Medicine

## 2018-12-06 ENCOUNTER — Other Ambulatory Visit: Payer: Self-pay

## 2018-12-06 DIAGNOSIS — R739 Hyperglycemia, unspecified: Secondary | ICD-10-CM | POA: Diagnosis not present

## 2018-12-06 DIAGNOSIS — R5383 Other fatigue: Secondary | ICD-10-CM | POA: Diagnosis not present

## 2018-12-06 DIAGNOSIS — K219 Gastro-esophageal reflux disease without esophagitis: Secondary | ICD-10-CM | POA: Diagnosis not present

## 2018-12-06 DIAGNOSIS — I10 Essential (primary) hypertension: Secondary | ICD-10-CM | POA: Insufficient documentation

## 2018-12-06 DIAGNOSIS — E785 Hyperlipidemia, unspecified: Secondary | ICD-10-CM | POA: Diagnosis not present

## 2018-12-06 NOTE — Progress Notes (Signed)
Virtual Visit via Video Note  I connected with Wesley Fletcher   on 12/06/18 at 12:30 PM EDT by a video enabled telemedicine application and verified that I am speaking with the correct person using two identifiers.  Location patient: home Location provider:work office Persons participating in the virtual visit: patient, provider  I discussed the limitations of evaluation and management by telemedicine and the availability of in person appointments. The patient expressed understanding and agreed to proceed.   Wesley Fletcher DOB: Jun 25, 1976 Encounter date: 12/06/2018  This is a 43 y.o. male who presents to establish care. Chief Complaint  Patient presents with  . Establish Care    transfer from Dr Selena Batten    History of present illness: Has recently found out some family history that he is concerned about.   Had been working on changing diet/losing weight (lost 30-40lb); cholesterol stayed high. Hasn't been able to recheck cholesterol. Worried about cholesterol for a couple of reasons. 8-9 years ago had crushing chest pain which brought him to knees, lasted 4-5 minutes. Was evaluated after this; nothing was found. Stated no heart attack. Gets chest pains frequently - stabbing, quick - last a couple of minutes. Worried that these are associated with his cholesterol. Chest pains are so random. Don't seem associated with exercise. Right in center of chest. Sometimes last 5-10 seconds but other times just a fleeting pain. No issues with heart racing, shortness of breath, sweaty when this happens. If it lasts longer makes him anxious and feels like he gets adrenaline rush so makes himself slow down breathing. No relation to eating. No specific time of day that it occurs. Usually not in the morning (but it happened this morning). Tends to happen at night more than any other time of day. Never woken from sleep with this.   Gets 5.5-6hrs/sleep/night. Feels rested with 6 hours sleep.   Stomach issue is other  concern. Grandfather died of esoph cancer; dad went to hospital a year ago with ulcer rupture. He has been taking prilosec otc  once daily.   GERD: prilosec did help with symptoms of heartburn/acid reflux. If he doesn't take this medication pain is very uncomfortable. Acid reflux did improve somewhat with weight loss, but not drastic. Thought when he changed diet he would be able to stop medication, but within 2 days he gets back to bad heartburn mode. Worse with certain food triggers. Has had coughing fit with drinking liquids or feels delayed ability to swallow. Has had issues with swallowing/food sticking sometimes. Sometimes "popping" epigastric area when he pushes on it. Only happened in last month. Stools have been dark a couple times  HL: Took medication for awhile to help with cholesterol, but medication made him feel really strange. Has switched job that allows him to dedicate more time to health. May have been uneasy/shaky just from reading.   Had depression sx in past and went to doc; was put on anti-depressants that made him "loopy" and felt even worse coming off of this. Has taken St John's Wort since that time and can tell when he stops this medication. Feels much more pleasant on medication.   Virtual reality helmet for exercise - has been doing boxing.   Dad is taking protonix for acid reflux and it works well for him.   Has not had sleep study done in past. Energy level varies based on sleep.   Past Medical History:  Diagnosis Date  . Anxiety   . Chest pain   . Depression   .  HTN (hypertension)    mild, when under stress from divorce   History reviewed. No pertinent surgical history. Allergies  Allergen Reactions  . Pollen Extract Other (See Comments)    Sneezing,cough, congestion,rhinitis   Current Meds  Medication Sig  . Omeprazole (PRILOSEC PO) Take by mouth daily.  . ST JOHNS WORT PO Take by mouth.   Social History   Tobacco Use  . Smoking status: Former  Smoker    Types: Cigarettes    Last attempt to quit: 07/19/2011    Years since quitting: 7.3  . Smokeless tobacco: Former Engineer, water Use Topics  . Alcohol use: Yes    Alcohol/week: 1.0 - 4.0 standard drinks    Types: 1 - 4 Standard drinks or equivalent per week   Family History  Problem Relation Age of Onset  . Hypertension Father   . Ulcers Father   . Thyroid disease Mother   . Other Son        dyslexia  . ALS Maternal Grandfather   . Esophageal cancer Paternal Grandfather   . ALS Maternal Great-grandfather        suspected; not diagnosed  . Aneurysm Maternal Grandmother        brain  . Dementia Paternal Grandmother   . Diabetes Maternal Uncle      Review of Systems  Constitutional: Negative for chills, fatigue and fever.  Respiratory: Negative for cough, chest tightness, shortness of breath and wheezing.   Cardiovascular: Positive for chest pain (see hpi- fleeting, not associated with activity). Negative for palpitations and leg swelling.  Gastrointestinal: Positive for blood in stool (has seen blood w wiping and has had dark stools). Negative for diarrhea, nausea and vomiting.  Psychiatric/Behavioral: Positive for sleep disturbance.    Objective:  There were no vitals taken for this visit.      BP Readings from Last 3 Encounters:  06/08/18 110/80  01/19/18 102/80  07/26/14 112/80   Wt Readings from Last 3 Encounters:  06/08/18 206 lb 12.8 oz (93.8 kg)  01/19/18 240 lb (108.9 kg)  07/26/14 222 lb 3.2 oz (100.8 kg)    EXAM:  GENERAL: alert, oriented, appears well and in no acute distress  HEENT: atraumatic, conjunctiva clear, no obvious abnormalities on inspection of external nose and ears  NECK: normal movements of the head and neck  LUNGS: on inspection no signs of respiratory distress, breathing rate appears normal, no obvious gross SOB, gasping or wheezing  CV: no obvious cyanosis  MS: moves all visible extremities without noticeable  abnormality  PSYCH/NEURO: pleasant and cooperative, no obvious depression or anxiety, speech and thought processing grossly intact  SKIN: no facial abnormality noted.   Assessment/Plan  1. Gastroesophageal reflux disease, esophagitis presence not specified Continue with prilosec; switch to 30 min prior to dinner. - Ambulatory referral to Gastroenterology  2. Hyperlipidemia, unspecified hyperlipidemia type Working on diet/exercise. Still eating fast food 3x/week. Had difficulty with food prep and trying to do mediterranean diet. - Comprehensive metabolic panel; Future - Lipid panel; Future - TSH; Future  3. Fatigue, unspecified type - Comprehensive metabolic panel; Future - CBC with Differential/Platelet; Future - Vitamin B12; Future  4. Hyperglycemia - Hemoglobin A1c; Future  Return labwork; likely physical to follow.    I discussed the assessment and treatment plan with the patient. The patient was provided an opportunity to ask questions and all were answered. The patient agreed with the plan and demonstrated an understanding of the instructions.   The patient was advised  to call back or seek an in-person evaluation if the symptoms worsen or if the condition fails to improve as anticipated.  I provided 32 minutes of non-face-to-face time during this encounter.   Theodis ShoveJunell Whitley Patchen, MD

## 2018-12-09 ENCOUNTER — Ambulatory Visit: Payer: BC Managed Care – PPO | Admitting: Family Medicine

## 2018-12-22 ENCOUNTER — Encounter: Payer: Self-pay | Admitting: Gastroenterology

## 2018-12-22 ENCOUNTER — Other Ambulatory Visit: Payer: Self-pay

## 2018-12-22 ENCOUNTER — Ambulatory Visit (INDEPENDENT_AMBULATORY_CARE_PROVIDER_SITE_OTHER): Payer: BC Managed Care – PPO | Admitting: Gastroenterology

## 2018-12-22 DIAGNOSIS — R1310 Dysphagia, unspecified: Secondary | ICD-10-CM | POA: Diagnosis not present

## 2018-12-22 DIAGNOSIS — K219 Gastro-esophageal reflux disease without esophagitis: Secondary | ICD-10-CM

## 2018-12-22 DIAGNOSIS — R1319 Other dysphagia: Secondary | ICD-10-CM

## 2018-12-22 NOTE — Progress Notes (Signed)
This patient contacted our office requesting a physician telemedicine consultation regarding clinical questions and/or test results.  If new patient, they were referred by PCP noted below  Participants on the conference : myself and patient   The patient consented to this consultation and was aware that a charge will be placed through their insurance.  I was in my office and the patient was at home   Encounter time:  Total time 45 minutes, with 35 minutes spent with patient on doximity   _____________________________________________________________________________________________              [facility] Consult Note:  History: Wesley Fletcher 12/22/2018  Referring provider: Caren Macadam, MD  Reason for consult/chief complaint: No chief complaint on file.   Subjective  HPI: From recent PCP telemedicine note: GERD on PPI, concerns re: father with ulcer, PGF with esophageal cancer.  Pyrosis and regurgitation recur soon if patient stops the Prilosec.  Intermittent feelings of food or liquids feeling stuck (or slow to pass) in the chest. Liquids infrequently cause cough  Sometimes a "popping sensation" in the epigastrium. Reflux persisted despite weight loss. Nocturnal regurgitation. Years of intermittent, sharp, non-exertional , brief chest pain.  Episode 10 years ago was prolonged, had cardiac workup. Takes the PPI in AM, works as well as when he tried it at supper. GERD symptoms did not improve when he was able to lose weight.  Overall, he has had many years of heartburn and regurgitation generally under good control on once daily 20 mg omeprazole.  However, symptoms persist despite therapy, and recur quickly after he stops the medicine.  He also has some mild dysphagia.  The other intermittent sharp brief chest pain that is gone on for years does not sound likely to be digestive.  ROS:  Review of Systems  Constitutional: Negative for appetite change  and unexpected weight change.  HENT: Negative for mouth sores and voice change.   Eyes: Negative for pain and redness.  Respiratory: Negative for cough and shortness of breath.   Cardiovascular: Negative for chest pain and palpitations.  Genitourinary: Negative for dysuria and hematuria.  Musculoskeletal: Negative for arthralgias and myalgias.  Skin: Negative for pallor and rash.  Neurological: Negative for weakness and headaches.  Hematological: Negative for adenopathy.     Past Medical History: Past Medical History:  Diagnosis Date  . Anxiety   . Chest pain   . Depression   . GERD (gastroesophageal reflux disease)   . HTN (hypertension)    mild, when under stress from divorce     Past Surgical History: No past surgical history on file. No prior surgery  Family History: Family History  Problem Relation Age of Onset  . Hypertension Father   . Ulcers Father   . Thyroid disease Mother   . Other Son        dyslexia  . ALS Maternal Grandfather   . Esophageal cancer Paternal Grandfather   . ALS Maternal Great-grandfather        suspected; not diagnosed  . Aneurysm Maternal Grandmother        brain  . Dementia Paternal Grandmother   . Diabetes Maternal Uncle   . Colon cancer Neg Hx   . Stomach cancer Neg Hx   . Pancreatic cancer Neg Hx     Social History: Social History   Socioeconomic History  . Marital status: Married    Spouse name: Not on file  . Number of children: Not on file  . Years of  education: Not on file  . Highest education level: Not on file  Occupational History  . Not on file  Social Needs  . Financial resource strain: Not on file  . Food insecurity    Worry: Not on file    Inability: Not on file  . Transportation needs    Medical: Not on file    Non-medical: Not on file  Tobacco Use  . Smoking status: Former Smoker    Types: Cigarettes    Quit date: 07/19/2011    Years since quitting: 7.4  . Smokeless tobacco: Former Engineer, waterUser  Substance  and Sexual Activity  . Alcohol use: Yes    Alcohol/week: 1.0 - 4.0 standard drinks    Types: 1 - 4 Standard drinks or equivalent per week  . Drug use: No  . Sexual activity: Not on file  Lifestyle  . Physical activity    Days per week: Not on file    Minutes per session: Not on file  . Stress: Not on file  Relationships  . Social Musicianconnections    Talks on phone: Not on file    Gets together: Not on file    Attends religious service: Not on file    Active member of club or organization: Not on file    Attends meetings of clubs or organizations: Not on file    Relationship status: Not on file  Other Topics Concern  . 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 Pollen Extract Other (See Comments)    Sneezing,cough, congestion,rhinitis    Outpatient Meds: Current Outpatient Medications  Medication Sig Dispense Refill  . Omeprazole (PRILOSEC PO) Take by mouth daily.    . ST JOHNS WORT PO Take by mouth.     No current facility-administered medications for this visit.     No aspirin Rare NSAID use  ___________________________________________________________________ Objective   Exam:  No exam - virtual visit He is well-appearing, with normal vocal quality  Data:  No recent data to review  Assessment: Encounter Diagnoses  Name Primary?  . Gastroesophageal reflux disease, esophagitis presence not specified Yes  . Esophageal dysphagia     Longstanding GERD, some symptoms persist despite therapy, also recurs quickly if off medicine.  It did not improve with weight loss, sounds like there could still be some dietary improvement.  Nevertheless, he is rightfully concerned because his father had  similar symptoms and then what sounds like bleeding ulcer, and his paternal grandfather had esophageal cancer.  GERD diet and lifestyle measures were reviewed.  Plan:  Upper endoscopy with possible dilation.  He is agreeable after discussion of procedure and risks.  Thank you for the courtesy of this consult.  Please call me with any questions or concerns.  Charlie PitterHenry L Danis III  CC: Referring provider noted above

## 2018-12-22 NOTE — Patient Instructions (Signed)
If you are age 41 or older, your body mass index should be between 23-30. Your There is no height or weight on file to calculate BMI. If this is out of the aforementioned range listed, please consider follow up with your Primary Care Provider.  If you are age 64 or younger, your body mass index should be between 19-25. Your There is no height or weight on file to calculate BMI. If this is out of the aformentioned range listed, please consider follow up with your Primary Care Provider.   You have been scheduled for an endoscopy and colonoscopy. Please follow the written instructions given to you at your visit today. Please pick up your prep supplies at the pharmacy within the next 1-3 days. If you use inhalers (even only as needed), please bring them with you on the day of your procedure.  It was a pleasure to see you today!  Dr. Danis  

## 2018-12-22 NOTE — Addendum Note (Signed)
Addended by: Lanny Hurst A on: 12/22/2018 02:38 PM   Modules accepted: Orders

## 2019-01-05 ENCOUNTER — Encounter: Payer: Self-pay | Admitting: Gastroenterology

## 2019-01-10 ENCOUNTER — Telehealth: Payer: Self-pay | Admitting: Gastroenterology

## 2019-01-10 NOTE — Telephone Encounter (Signed)

## 2019-01-11 ENCOUNTER — Ambulatory Visit (AMBULATORY_SURGERY_CENTER): Payer: BC Managed Care – PPO | Admitting: Gastroenterology

## 2019-01-11 ENCOUNTER — Encounter: Payer: Self-pay | Admitting: Gastroenterology

## 2019-01-11 ENCOUNTER — Other Ambulatory Visit: Payer: Self-pay

## 2019-01-11 VITALS — BP 123/69 | HR 66 | Temp 97.8°F | Resp 20 | Ht 72.0 in | Wt 225.0 lb

## 2019-01-11 DIAGNOSIS — K222 Esophageal obstruction: Secondary | ICD-10-CM | POA: Diagnosis not present

## 2019-01-11 DIAGNOSIS — K219 Gastro-esophageal reflux disease without esophagitis: Secondary | ICD-10-CM | POA: Diagnosis present

## 2019-01-11 DIAGNOSIS — K449 Diaphragmatic hernia without obstruction or gangrene: Secondary | ICD-10-CM

## 2019-01-11 MED ORDER — SODIUM CHLORIDE 0.9 % IV SOLN: 500.0000 mL | Freq: Once | INTRAVENOUS | Status: DC

## 2019-01-11 NOTE — Progress Notes (Signed)
A/ox3, pleased with MAC, report to RN 

## 2019-01-11 NOTE — Op Note (Signed)
[facility] Patient Name: Wesley Fletcher Procedure Date: 01/11/2019 4:00 PM MRN: [id number] Endoscopist: Mallie Mussel L. Loletha Carrow , MD Age: 43 Referring MD:  Date of Birth: [birth year] Gender: Male Account #: [id number] Procedure:                Upper GI endoscopy Indications:              Esophageal reflux symptoms that persist despite                            appropriate therapy Medicines:                Monitored Anesthesia Care Procedure:                Pre-Anesthesia Assessment:                           - Prior to the procedure, a History and Physical                            was performed, and patient medications and                            allergies were reviewed. The patient's tolerance of                            previous anesthesia was also reviewed. The risks                            and benefits of the procedure and the sedation                            options and risks were discussed with the patient.                            All questions were answered, and informed consent                            was obtained. Prior Anticoagulants: The patient has                            taken no previous anticoagulant or antiplatelet                            agents. ASA Grade Assessment: II - A patient with                            mild systemic disease. After reviewing the risks                            and benefits, the patient was deemed in                            satisfactory condition to undergo the procedure.  After obtaining informed consent, the endoscope was                            passed under direct vision. Throughout the                            procedure, the patient's blood pressure, pulse, and                            oxygen saturations were monitored continuously. The                            Endoscope was introduced through the mouth, and                            advanced to the second part of duodenum.  The upper                            GI endoscopy was accomplished without difficulty.                            The patient tolerated the procedure well. Scope In: Scope Out: Findings:                 The larynx was normal.                           A widely patent Schatzki ring was found at the                            gastroesophageal junction.                           A 1-2 cm sliding hiatal hernia was present.                           There is no endoscopic evidence of Barrett's                            esophagus or esophagitis in the entire esophagus.                           The stomach was normal.                           The cardia and gastric fundus were normal on                            retroflexion.                           The examined duodenum was normal. Complications:            No immediate complications. Estimated Blood Loss:     Estimated blood loss: none. Impression:               - Normal  larynx.                           - Widely patent Schatzki ring.                           - 1 cm hiatal hernia.                           - Normal stomach.                           - Normal examined duodenum.                           - No specimens collected. Recommendation:           - Patient has a contact number available for                            emergencies. The signs and symptoms of potential                            delayed complications were discussed with the                            patient. Return to normal activities tomorrow.                            Written discharge instructions were provided to the                            patient.                           - Resume previous diet.                           - Continue present medications.                           - Consider esophageal pH/impedance and manometry                            testing if desires consideration of TIF anti-reflux                            procedure. Henry  L. Myrtie Neitheranis, MD 01/11/2019 4:16:18 PM This report has been signed electronically.

## 2019-01-11 NOTE — Patient Instructions (Signed)
YOU HAD AN ENDOSCOPIC PROCEDURE TODAY AT THE [facility]:   Refer to the procedure report that was given to you for any specific questions about what was found during the examination.  If the procedure report does not answer your questions, please call your gastroenterologist to clarify.  If you requested that your care partner not be given the details of your procedure findings, then the procedure report has been included in a sealed envelope for you to review at your convenience later.  YOU SHOULD EXPECT: Some feelings of bloating in the abdomen. Passage of more gas than usual.  Walking can help get rid of the air that was put into your GI tract during the procedure and reduce the bloating. If you had a lower endoscopy (such as a colonoscopy or flexible sigmoidoscopy) you may notice spotting of blood in your stool or on the toilet paper. If you underwent a bowel prep for your procedure, you may not have a normal bowel movement for a few days.  Please Note:  You might notice some irritation and congestion in your nose or some drainage.  This is from the oxygen used during your procedure.  There is no need for concern and it should clear up in a day or so.  SYMPTOMS TO REPORT IMMEDIATELY:   Following upper endoscopy (EGD)  Vomiting of blood or coffee ground material  New chest pain or pain under the shoulder blades  Painful or persistently difficult swallowing  New shortness of breath  Fever of 100F or higher  Black, tarry-looking stools  For urgent or emergent issues, a gastroenterologist can be reached at any hour by calling [phone].   DIET:  We do recommend a small meal at first, but then you may proceed to your regular diet.  Drink plenty of fluids but you should avoid alcoholic beverages for 24 hours.  ACTIVITY:  You should plan to take it easy for the rest of today and you should NOT DRIVE or use heavy machinery until tomorrow (because of the sedation medicines used  during the test).    FOLLOW UP: Our staff will call the number listed on your records 48-72 hours following your procedure to check on you and address any questions or concerns that you may have regarding the information given to you following your procedure. If we do not reach you, we will leave a message.  We will attempt to reach you two times.  During this call, we will ask if you have developed any symptoms of COVID 19. If you develop any symptoms (ie: fever, flu-like symptoms, shortness of breath, cough etc.) before then, please call [phone].  If you test positive for Covid 19 in the 2 weeks post procedure, please call and report this information to us.    If any biopsies were taken you will be contacted by phone or by letter within the next 1-3 weeks.  Please call us at [phone] if you have not heard about the biopsies in 3 weeks.    SIGNATURES/CONFIDENTIALITY: You and/or your care partner have signed paperwork which will be entered into your electronic medical record.  These signatures attest to the fact that that the information above on your After Visit Summary has been reviewed and is understood.  Full responsibility of the confidentiality of this discharge information lies with you and/or your care-partner. 

## 2019-01-13 ENCOUNTER — Telehealth: Payer: Self-pay | Admitting: *Deleted

## 2019-01-13 NOTE — Telephone Encounter (Signed)
  Follow up Call-  Call back number 01/11/2019  Post procedure Call Back phone  # [phone]  Permission to leave phone message Yes  Some recent data might be hidden     Patient questions:  Do you have a fever, pain , or abdominal swelling? No. Pain Score  0 *  Have you tolerated food without any problems? Yes.    Have you been able to return to your normal activities? Yes.    Do you have any questions about your discharge instructions: Diet   No. Medications  No. Follow up visit  No.  Do you have questions or concerns about your Care? No.  Actions: * If pain score is 4 or above: No action needed, pain <4.  1. Have you developed a fever since your procedure? no  2.   Have you had an respiratory symptoms (SOB or cough) since your procedure? no  3.   Have you tested positive for COVID 19 since your procedure no  4.   Have you had any family members/close contacts diagnosed with the COVID 19 since your procedure?  no   If yes to any of these questions please route to Joylene John, RN and Alphonsa Gin, Therapist, sports.

## 2019-02-04 ENCOUNTER — Other Ambulatory Visit: Payer: Self-pay

## 2019-02-04 ENCOUNTER — Encounter: Payer: Self-pay | Admitting: Family Medicine

## 2019-02-04 ENCOUNTER — Ambulatory Visit (INDEPENDENT_AMBULATORY_CARE_PROVIDER_SITE_OTHER): Payer: BC Managed Care – PPO | Admitting: Family Medicine

## 2019-02-04 VITALS — BP 98/70 | HR 75 | Temp 97.4°F | Ht 71.75 in | Wt 230.2 lb

## 2019-02-04 DIAGNOSIS — Z0000 Encounter for general adult medical examination without abnormal findings: Secondary | ICD-10-CM

## 2019-02-04 DIAGNOSIS — R5383 Other fatigue: Secondary | ICD-10-CM | POA: Diagnosis not present

## 2019-02-04 DIAGNOSIS — R739 Hyperglycemia, unspecified: Secondary | ICD-10-CM | POA: Diagnosis not present

## 2019-02-04 DIAGNOSIS — E785 Hyperlipidemia, unspecified: Secondary | ICD-10-CM | POA: Diagnosis not present

## 2019-02-04 LAB — CBC WITH DIFFERENTIAL/PLATELET
Basophils Absolute: 0 10*3/uL (ref 0.0–0.1)
Basophils Relative: 0.4 % (ref 0.0–3.0)
Eosinophils Absolute: 0.1 10*3/uL (ref 0.0–0.7)
Eosinophils Relative: 1.8 % (ref 0.0–5.0)
HCT: 46.5 % (ref 39.0–52.0)
Hemoglobin: 15.9 g/dL (ref 13.0–17.0)
Lymphocytes Relative: 30.9 % (ref 12.0–46.0)
Lymphs Abs: 2 10*3/uL (ref 0.7–4.0)
MCHC: 34.2 g/dL (ref 30.0–36.0)
MCV: 82.1 fl (ref 78.0–100.0)
Monocytes Absolute: 0.4 10*3/uL (ref 0.1–1.0)
Monocytes Relative: 6.5 % (ref 3.0–12.0)
Neutro Abs: 3.9 10*3/uL (ref 1.4–7.7)
Neutrophils Relative %: 60.4 % (ref 43.0–77.0)
Platelets: 261 10*3/uL (ref 150.0–400.0)
RBC: 5.66 Mil/uL (ref 4.22–5.81)
RDW: 13 % (ref 11.5–15.5)
WBC: 6.5 10*3/uL (ref 4.0–10.5)

## 2019-02-04 LAB — LIPID PANEL
Cholesterol: 227 mg/dL — ABNORMAL HIGH (ref 0–200)
HDL: 42.6 mg/dL (ref >39.00)
NonHDL: 184.54
Total CHOL/HDL Ratio: 5
Triglycerides: 276 mg/dL — ABNORMAL HIGH (ref 0.0–149.0)
VLDL: 55.2 mg/dL — ABNORMAL HIGH (ref 0.0–40.0)

## 2019-02-04 LAB — COMPREHENSIVE METABOLIC PANEL
ALT: 19 U/L (ref 0–53)
AST: 15 U/L (ref 0–37)
Albumin: 4.8 g/dL (ref 3.5–5.2)
Alkaline Phosphatase: 55 U/L (ref 39–117)
BUN: 18 mg/dL (ref 6–23)
CO2: 30 mEq/L (ref 19–32)
Calcium: 9.7 mg/dL (ref 8.4–10.5)
Chloride: 101 mEq/L (ref 96–112)
Creatinine, Ser: 1.01 mg/dL (ref 0.40–1.50)
GFR: 81.31 mL/min (ref >60.00)
Glucose, Bld: 94 mg/dL (ref 70–99)
Potassium: 4.1 mEq/L (ref 3.5–5.1)
Sodium: 138 mEq/L (ref 135–145)
Total Bilirubin: 0.4 mg/dL (ref 0.2–1.2)
Total Protein: 7.1 g/dL (ref 6.0–8.3)

## 2019-02-04 LAB — HEMOGLOBIN A1C: Hgb A1c MFr Bld: 5.6 % (ref 4.6–6.5)

## 2019-02-04 LAB — LDL CHOLESTEROL, DIRECT: Direct LDL: 150 mg/dL

## 2019-02-04 LAB — VITAMIN B12: Vitamin B-12: 328 pg/mL (ref 211–911)

## 2019-02-04 LAB — TSH: TSH: 1.2 u[IU]/mL (ref 0.35–4.50)

## 2019-02-04 NOTE — Patient Instructions (Signed)
It was nice to meet you in person today!   Keep up with regular exercise. Work on that portion control and if you feel you need more help with weight loss let me know.   I will call you once I get lab results.

## 2019-02-04 NOTE — Progress Notes (Signed)
Wesley Fletcher DOB: 1976-04-24 Encounter date: 02/04/2019  This is a 43 y.o. male who presents for complete physical   History of present illness/Additional concerns: Wants to get cholesterol checked today. Worries about this and heart disease.   Feels like he is in better shape. Issue with weight gain is portion sizes. Exercising more which he didn't before. Got weight down to 200 doing intermittent fasting/keto. Then went back to old eating habits.   Past Medical History:  Diagnosis Date  . Anxiety   . Chest pain   . Depression   . GERD (gastroesophageal reflux disease)   . HTN (hypertension)    mild, when under stress from divorce   History reviewed. No pertinent surgical history. Allergies  Allergen Reactions  . Pollen Extract Other (See Comments)    Sneezing,cough, congestion,rhinitis   Current Meds  Medication Sig  . Omeprazole (PRILOSEC PO) Take by mouth daily.  . ST JOHNS WORT PO Take by mouth.   Current Facility-Administered Medications for the 02/04/19 encounter (Office Visit) with Caren Macadam, MD  Medication  . 0.9 %  sodium chloride infusion   Social History   Tobacco Use  . Smoking status: Former Smoker    Types: Cigarettes    Quit date: 07/19/2011    Years since quitting: 7.5  . Smokeless tobacco: Former Network engineer Use Topics  . Alcohol use: Yes    Alcohol/week: 1.0 - 4.0 standard drinks    Types: 1 - 4 Standard drinks or equivalent per week   Family History  Problem Relation Age of Onset  . Hypertension Father   . Ulcers Father   . Thyroid disease Mother   . Other Son        dyslexia  . ALS Maternal Grandfather   . Esophageal cancer Paternal Grandfather   . ALS Maternal Great-grandfather        suspected; not diagnosed  . Aneurysm Maternal Grandmother        brain  . Dementia Paternal Grandmother   . Diabetes Maternal Uncle   . Colon cancer Neg Hx   . Stomach cancer Neg Hx   . Pancreatic cancer Neg Hx      Review of Systems   Constitutional: Negative for activity change, appetite change, chills, fatigue, fever and unexpected weight change.  HENT: Negative for congestion, ear pain, hearing loss, sinus pressure, sinus pain, sore throat and trouble swallowing.   Eyes: Negative for pain and visual disturbance.  Respiratory: Negative for cough, chest tightness, shortness of breath and wheezing.   Cardiovascular: Negative for chest pain, palpitations and leg swelling.  Gastrointestinal: Negative for abdominal distention, abdominal pain, blood in stool, constipation, diarrhea, nausea and vomiting.  Genitourinary: Negative for decreased urine volume, difficulty urinating, dysuria, penile pain and testicular pain.  Musculoskeletal: Negative for arthralgias, back pain and joint swelling.  Skin: Negative for rash.  Neurological: Negative for dizziness, weakness, numbness and headaches.  Hematological: Negative for adenopathy. Does not bruise/bleed easily.  Psychiatric/Behavioral: Negative for agitation, sleep disturbance and suicidal ideas. The patient is not nervous/anxious.     CBC:  Lab Results  Component Value Date   WBC 6.5 02/04/2019   HGB 15.9 02/04/2019   HCT 46.5 02/04/2019   MCH 28.0 12/07/2010   MCHC 34.2 02/04/2019   RDW 13.0 02/04/2019   PLT 261.0 02/04/2019   CMP: Lab Results  Component Value Date   NA 138 02/04/2019   K 4.1 02/04/2019   CL 101 02/04/2019   CO2 30 02/04/2019  GLUCOSE 94 02/04/2019   BUN 18 02/04/2019   CREATININE 1.01 02/04/2019   GFRAA  12/07/2010    >60        The eGFR has been calculated using the MDRD equation. This calculation has not been validated in all clinical situations. eGFR's persistently <60 mL/min signify possible Chronic Kidney Disease.   CALCIUM 9.7 02/04/2019   PROT 7.1 02/04/2019   BILITOT 0.4 02/04/2019   ALKPHOS 55 02/04/2019   ALT 19 02/04/2019   AST 15 02/04/2019   LIPID: Lab Results  Component Value Date   CHOL 227 (H) 02/04/2019   TRIG  276.0 (H) 02/04/2019   HDL 42.60 02/04/2019   LDLCALC 173 (H) 06/08/2018    Objective:  BP 98/70 (BP Location: Left Arm, Patient Position: Sitting, Cuff Size: Large)   Pulse 75   Temp (!) 97.4 F (36.3 C) (Temporal)   Ht 5' 11.75" (1.822 m)   Wt 230 lb 3.2 oz (104.4 kg)   SpO2 98%   BMI 31.44 kg/m   Weight: 230 lb 3.2 oz (104.4 kg)   BP Readings from Last 3 Encounters:  02/04/19 98/70  01/11/19 123/69  06/08/18 110/80   Wt Readings from Last 3 Encounters:  02/04/19 230 lb 3.2 oz (104.4 kg)  01/11/19 225 lb (102.1 kg)  06/08/18 206 lb 12.8 oz (93.8 kg)    Physical Exam Constitutional:      General: He is not in acute distress.    Appearance: He is well-developed.  HENT:     Head: Normocephalic and atraumatic.     Right Ear: External ear normal.     Left Ear: External ear normal.     Nose: Nose normal.     Mouth/Throat:     Pharynx: No oropharyngeal exudate.  Eyes:     Conjunctiva/sclera: Conjunctivae normal.     Pupils: Pupils are equal, round, and reactive to light.  Neck:     Musculoskeletal: Neck supple.     Thyroid: No thyromegaly.  Cardiovascular:     Rate and Rhythm: Normal rate and regular rhythm.     Heart sounds: Normal heart sounds. No murmur. No friction rub. No gallop.   Pulmonary:     Effort: Pulmonary effort is normal. No respiratory distress.     Breath sounds: Normal breath sounds. No stridor. No wheezing or rales.  Abdominal:     General: Bowel sounds are normal.     Palpations: Abdomen is soft.  Genitourinary:    Penis: Normal and circumcised.      Scrotum/Testes: Normal.        Right: Mass, tenderness or swelling not present.        Left: Mass, tenderness or swelling not present.  Musculoskeletal: Normal range of motion.  Skin:    General: Skin is warm and dry.     Comments: No noted concerning skin moles.  Neurological:     Mental Status: He is alert and oriented to person, place, and time.  Psychiatric:        Behavior: Behavior  normal.        Thought Content: Thought content normal.        Judgment: Judgment normal.     Assessment/Plan: Health Maintenance Due  Topic Date Due  . INFLUENZA VACCINE  02/05/2019   Health Maintenance reviewed - urinary microalbumin ordered.  1. Preventative health care Keep up with exercise, portion control stressed. bloodwork was already ordered at last visit so he will complete that today. He is worried  about cardiac disease, so we discussed we will determine need for further evaluation pending bloodwork.   Return pending lab results.  Micheline Rough, MD

## 2019-02-11 ENCOUNTER — Other Ambulatory Visit: Payer: Self-pay | Admitting: Family Medicine

## 2019-02-11 DIAGNOSIS — E785 Hyperlipidemia, unspecified: Secondary | ICD-10-CM

## 2019-08-03 ENCOUNTER — Other Ambulatory Visit: Payer: Self-pay

## 2019-08-04 ENCOUNTER — Other Ambulatory Visit: Payer: Self-pay

## 2019-08-10 ENCOUNTER — Other Ambulatory Visit: Payer: Self-pay

## 2019-08-11 ENCOUNTER — Other Ambulatory Visit (INDEPENDENT_AMBULATORY_CARE_PROVIDER_SITE_OTHER): Payer: 59

## 2019-08-11 DIAGNOSIS — E785 Hyperlipidemia, unspecified: Secondary | ICD-10-CM | POA: Diagnosis not present

## 2019-08-11 LAB — LIPID PANEL
Cholesterol: 159 mg/dL (ref 0–200)
HDL: 41.5 mg/dL (ref >39.00)
LDL Cholesterol: 86 mg/dL (ref 0–99)
NonHDL: 117.68
Total CHOL/HDL Ratio: 4
Triglycerides: 157 mg/dL — ABNORMAL HIGH (ref 0.0–149.0)
VLDL: 31.4 mg/dL (ref 0.0–40.0)

## 2020-04-19 ENCOUNTER — Ambulatory Visit: Payer: 59 | Attending: Internal Medicine

## 2020-04-19 DIAGNOSIS — Z23 Encounter for immunization: Secondary | ICD-10-CM

## 2020-04-19 NOTE — Progress Notes (Signed)
° °  Covid-19 Vaccination Clinic  Name:  Wesley Fletcher    MRN: [id number] DOB: Apr 13, 1976  04/19/2020  [name] was observed post Covid-19 immunization for 15 minutes without incident. He was provided with Vaccine Information Sheet and instruction to access the V-Safe system.   [name] was instructed to call 911 with any severe reactions post vaccine:  Difficulty breathing   Swelling of face and throat   A fast heartbeat   A bad rash all over body   Dizziness and weakness

## 2021-03-22 ENCOUNTER — Encounter: Payer: Self-pay | Admitting: Family Medicine

## 2021-03-22 ENCOUNTER — Ambulatory Visit (INDEPENDENT_AMBULATORY_CARE_PROVIDER_SITE_OTHER)
Admission: RE | Admit: 2021-03-22 | Discharge: 2021-03-22 | Disposition: A | Discharge status: Home / Self Care | Payer: 59 | Source: Ambulatory Visit | Attending: Family Medicine | Admitting: Family Medicine

## 2021-03-22 ENCOUNTER — Telehealth (INDEPENDENT_AMBULATORY_CARE_PROVIDER_SITE_OTHER): Payer: 59 | Admitting: Family Medicine

## 2021-03-22 ENCOUNTER — Other Ambulatory Visit: Payer: Self-pay

## 2021-03-22 VITALS — Ht 72.0 in | Wt 182.0 lb

## 2021-03-22 DIAGNOSIS — M549 Dorsalgia, unspecified: Secondary | ICD-10-CM

## 2021-03-22 NOTE — Progress Notes (Signed)
Virtual Visit via Video Note  I connected withNAME@ on 03/22/21 at 11:30 AM EDT by a video enabled telemedicine application and verified that I am speaking with the correct person using two identifiers.  Location patient: home Location provider: [facility]  [address] [place], [place] [address] Persons participating in the virtual visit: patient, provider  I discussed the limitations of evaluation and management by telemedicine and the availability of in person appointments. The patient expressed understanding and agreed to proceed.   Wesley Fletcher DOB: 04/15/1976 Encounter date: 03/22/2021  This is a 45 y.o. male who presents with Chief Complaint  Patient presents with   Back Pain    Patient complains of dull, mid-back pain intermittently x2 months, no known injury    History of present illness:  Was smoker for 20 years. This is in back of mind.   Has lower back issues - bulging disc; and knows what that feels like. Upper back between spine and shoulder blade. Not constant. Not sure if nerve related or deeper. Can't tell if muscle. Feels like raw spot inside of back. 2/10; not consistent.   No problems breathing. No cough.   No skin changes over area of pain. Feels a little with pressured flexion on neck - feels closer to spine than off center. No limited range of motion of neck or shoulders.   Does have shoulder issue; this is new - he pulled up on something and felt a pop (just 3 days ago) but has been able to regain total movement of arm.   He has been working on weight loss. Peak weight up to 247; worked with wife on [employer]avia and both have lost significant weight. He wants to get down to 172 before putting back on some muscle. He is feeling well, exercising regularly.  Allergies  Allergen Reactions   Pollen Extract Other (See Comments)    Sneezing,cough, congestion,rhinitis   Current Meds  Medication Sig   GLUCOSAMINE-CHONDROITIN PO Take by mouth daily.    OVER THE COUNTER MEDICATION Mushroom complex daily   ST JOHNS WORT PO Take by mouth.   Current Facility-Administered Medications for the 03/22/21 encounter (Video Visit) with Wynn Banker, MD  Medication   0.9 %  sodium chloride infusion    Review of Systems  Constitutional:  Negative for chills, fatigue and fever.  Respiratory:  Negative for cough, chest tightness, shortness of breath and wheezing.   Cardiovascular:  Negative for chest pain, palpitations and leg swelling.  Musculoskeletal:  Positive for back pain (upper back; centered between shoulder blades).   Objective:  Ht 6' (1.829 m)   Wt 182 lb (82.6 kg)   BMI 24.68 kg/m   Weight: 182 lb (82.6 kg)   BP Readings from Last 3 Encounters:  02/04/19 98/70  01/11/19 123/69  06/08/18 110/80   Wt Readings from Last 3 Encounters:  03/22/21 182 lb (82.6 kg)  02/04/19 230 lb 3.2 oz (104.4 kg)  01/11/19 225 lb (102.1 kg)    EXAM:  GENERAL: alert, oriented, appears well and in no acute distress  HEENT: atraumatic, conjunctiva clear, no obvious abnormalities on inspection of external nose and ears  NECK: normal movements of the head and neck  LUNGS: on inspection no signs of respiratory distress, breathing rate appears normal, no obvious gross SOB, gasping or wheezing  CV: no obvious cyanosis  MS: moves all visible extremities without noticeable abnormality. If flexes neck and pushes on back of head; he can reproduce pull/pain  sensation.  PSYCH/NEURO: pleasant and cooperative, no obvious depression or anxiety, speech and thought processing grossly intact   Assessment/Plan 1. Upper back pain Start with cxr - he is worried with smoking hx, but age doesn't qualify for more advanced chest imaging at this point for screening and diagnostic not indicated. Should be able to see any significant bone abnormalities on xray. If normal, consider sports med evaluation. Discussed that there are multiple muscle attachments in  area of concern that could be related.  - DG Chest 2 View; Future   has physical set up in december  I discussed the assessment and treatment plan with the patient. The patient was provided an opportunity to ask questions and all were answered. The patient agreed with the plan and demonstrated an understanding of the instructions.   The patient was advised to call back or seek an in-person evaluation if the symptoms worsen or if the condition fails to improve as anticipated.  I provided 15 minutes of non-face-to-face time during this encounter.   Wesley Shove, MD

## 2021-06-10 ENCOUNTER — Ambulatory Visit (INDEPENDENT_AMBULATORY_CARE_PROVIDER_SITE_OTHER): Payer: 59

## 2021-06-10 ENCOUNTER — Other Ambulatory Visit: Payer: Self-pay

## 2021-06-10 ENCOUNTER — Ambulatory Visit (INDEPENDENT_AMBULATORY_CARE_PROVIDER_SITE_OTHER): Payer: 59 | Admitting: Family Medicine

## 2021-06-10 ENCOUNTER — Encounter: Payer: Self-pay | Admitting: Family Medicine

## 2021-06-10 VITALS — BP 90/60 | HR 61 | Temp 98.0°F | Ht 71.25 in | Wt 193.1 lb

## 2021-06-10 DIAGNOSIS — G629 Polyneuropathy, unspecified: Secondary | ICD-10-CM

## 2021-06-10 DIAGNOSIS — E785 Hyperlipidemia, unspecified: Secondary | ICD-10-CM

## 2021-06-10 DIAGNOSIS — R739 Hyperglycemia, unspecified: Secondary | ICD-10-CM | POA: Diagnosis not present

## 2021-06-10 DIAGNOSIS — M436 Torticollis: Secondary | ICD-10-CM

## 2021-06-10 DIAGNOSIS — Z0000 Encounter for general adult medical examination without abnormal findings: Secondary | ICD-10-CM | POA: Diagnosis not present

## 2021-06-10 LAB — LIPID PANEL
Cholesterol: 189 mg/dL (ref 0–200)
HDL: 54.3 mg/dL (ref >39.00)
LDL Cholesterol: 115 mg/dL — ABNORMAL HIGH (ref 0–99)
NonHDL: 134.49
Total CHOL/HDL Ratio: 3
Triglycerides: 99 mg/dL (ref 0.0–149.0)
VLDL: 19.8 mg/dL (ref 0.0–40.0)

## 2021-06-10 LAB — COMPREHENSIVE METABOLIC PANEL
ALT: 17 U/L (ref 0–53)
AST: 16 U/L (ref 0–37)
Albumin: 4.6 g/dL (ref 3.5–5.2)
Alkaline Phosphatase: 34 U/L — ABNORMAL LOW (ref 39–117)
BUN: 16 mg/dL (ref 6–23)
CO2: 31 mEq/L (ref 19–32)
Calcium: 9.5 mg/dL (ref 8.4–10.5)
Chloride: 101 mEq/L (ref 96–112)
Creatinine, Ser: 0.93 mg/dL (ref 0.40–1.50)
GFR: 100.52 mL/min (ref >60.00)
Glucose, Bld: 101 mg/dL — ABNORMAL HIGH (ref 70–99)
Potassium: 3.7 mEq/L (ref 3.5–5.1)
Sodium: 138 mEq/L (ref 135–145)
Total Bilirubin: 0.7 mg/dL (ref 0.2–1.2)
Total Protein: 6.8 g/dL (ref 6.0–8.3)

## 2021-06-10 LAB — CBC WITH DIFFERENTIAL/PLATELET
Basophils Absolute: 0 10*3/uL (ref 0.0–0.1)
Basophils Relative: 0.5 % (ref 0.0–3.0)
Eosinophils Absolute: 0.1 10*3/uL (ref 0.0–0.7)
Eosinophils Relative: 2.2 % (ref 0.0–5.0)
HCT: 43.7 % (ref 39.0–52.0)
Hemoglobin: 14.6 g/dL (ref 13.0–17.0)
Lymphocytes Relative: 33.4 % (ref 12.0–46.0)
Lymphs Abs: 1.8 10*3/uL (ref 0.7–4.0)
MCHC: 33.4 g/dL (ref 30.0–36.0)
MCV: 84.3 fl (ref 78.0–100.0)
Monocytes Absolute: 0.4 10*3/uL (ref 0.1–1.0)
Monocytes Relative: 7.2 % (ref 3.0–12.0)
Neutro Abs: 3.1 10*3/uL (ref 1.4–7.7)
Neutrophils Relative %: 56.7 % (ref 43.0–77.0)
Platelets: 223 10*3/uL (ref 150.0–400.0)
RBC: 5.18 Mil/uL (ref 4.22–5.81)
RDW: 12.9 % (ref 11.5–15.5)
WBC: 5.5 10*3/uL (ref 4.0–10.5)

## 2021-06-10 LAB — HEMOGLOBIN A1C: Hgb A1c MFr Bld: 5.5 % (ref 4.6–6.5)

## 2021-06-10 NOTE — Progress Notes (Signed)
Wesley Fletcher DOB: [birth year] Encounter date: 06/10/2021  This is a 45 y.o. male who presents with Chief Complaint  Patient presents with   Annual Exam   Back Pain    Patient complains of left-sided upper back pain intermittently x2 months    History of present illness: Pain in right side of back went away completely, but then started having pain on left side - coming and going. Not related to physical activity. Dull; sometimes goes away for week and a half, but then comes back. Sometimes strong like 7/10. Can last 6-8 hours. Medicine doesn't help. Nothing otc makes difference, no change with changes in position. Doesn't feel like it is in the same location as the right sided before. Feels like it travels a little - sometimes higher on back, sometimes lower, and then sometimes left upper chest/shoulder feels like it goes to sleep. Was born/grew on on [facility], so this makes him worry more. Does feel like neck is tight at times. Stretching doesn't help. Left shoulder (has unrelated injury - was pulling up to shut a door and felt a tear/pop in shoulder - maybe around September). Shoulder discomfort feels different than the numbness he feels around this area. Doesn't seem to affect breathing at all. Really feels like it is nerve related. Other day did feel like there were electrical jolts going through up and down left back. Also considers fam hx of grandfather with ALS.   Besides this everything else feels great. Appetite is good - feels like he is eating too much. Eating healthy, but a little worse eating over holidays.   HTN: diet controlled HL: diet controlled GERD: diet controlled; resolved with dietary change  Allergies  Allergen Reactions   Pollen Extract Other (See Comments)    Sneezing,cough, congestion,rhinitis   Current Meds  Medication Sig   GLUCOSAMINE-CHONDROITIN PO Take by mouth daily.   OVER THE COUNTER MEDICATION Mushroom complex daily   ST JOHNS WORT PO Take by mouth.    Current Facility-Administered Medications for the 06/10/21 encounter (Office Visit) with Caren Macadam, MD  Medication   0.9 %  sodium chloride infusion    Review of Systems  Constitutional:  Negative for activity change, appetite change, chills, fatigue, fever and unexpected weight change.  HENT:  Negative for congestion, ear pain, hearing loss, sinus pressure, sinus pain, sore throat and trouble swallowing.   Eyes:  Negative for pain and visual disturbance.  Respiratory:  Negative for cough, chest tightness, shortness of breath and wheezing.   Cardiovascular:  Negative for chest pain, palpitations and leg swelling.  Gastrointestinal:  Negative for abdominal distention, abdominal pain, blood in stool, constipation, diarrhea, nausea and vomiting.  Genitourinary:  Negative for decreased urine volume, difficulty urinating, dysuria, penile pain and testicular pain.  Musculoskeletal:  Negative for arthralgias, back pain and joint swelling.  Skin:  Negative for rash.  Neurological:  Negative for dizziness, weakness, numbness (see hpi) and headaches.  Hematological:  Negative for adenopathy. Does not bruise/bleed easily.  Psychiatric/Behavioral:  Negative for agitation, sleep disturbance and suicidal ideas. The patient is not nervous/anxious.    Objective:  BP 90/60 (BP Location: Left Arm, Patient Position: Sitting, Cuff Size: Large)   Pulse 61   Temp 98 F (36.7 C) (Oral)   Ht 5' 11.25" (1.81 m)   Wt 193 lb 1.6 oz (87.6 kg)   SpO2 99%   BMI 26.74 kg/m   Weight: 193 lb 1.6 oz (87.6 kg)   BP Readings from Last 3  Encounters:  06/10/21 90/60  02/04/19 98/70  01/11/19 123/69   Wt Readings from Last 3 Encounters:  06/10/21 193 lb 1.6 oz (87.6 kg)  03/22/21 182 lb (82.6 kg)  02/04/19 230 lb 3.2 oz (104.4 kg)    Physical Exam Constitutional:      General: He is not in acute distress.    Appearance: He is well-developed.  HENT:     Head: Normocephalic and atraumatic.      Right Ear: External ear normal.     Left Ear: External ear normal.     Nose: Nose normal.     Mouth/Throat:     Pharynx: No oropharyngeal exudate.  Eyes:     Conjunctiva/sclera: Conjunctivae normal.     Pupils: Pupils are equal, round, and reactive to light.  Neck:     Thyroid: No thyromegaly.  Cardiovascular:     Rate and Rhythm: Normal rate and regular rhythm.     Heart sounds: Normal heart sounds. No murmur heard.   No friction rub. No gallop.  Pulmonary:     Effort: Pulmonary effort is normal. No respiratory distress.     Breath sounds: Normal breath sounds. No stridor. No wheezing or rales.  Abdominal:     General: Bowel sounds are normal.     Palpations: Abdomen is soft.  Genitourinary:    Penis: Normal and circumcised.      Testes: Normal.  Musculoskeletal:        General: Normal range of motion.     Cervical back: Neck supple.     Comments: Full rom of neck, shoulders. Axial loading does reproduce dull left sided bad discomfort, tingling sensation left side of back/chest. Negative spurlings.   Skin:    General: Skin is warm and dry.  Neurological:     Mental Status: He is alert and oriented to person, place, and time.  Psychiatric:        Behavior: Behavior normal.        Thought Content: Thought content normal.        Judgment: Judgment normal.    Assessment/Plan 1. Preventative health care Keep up with regular exercise, healthy eating.   2. Hyperlipidemia, unspecified hyperlipidemia type - Comprehensive metabolic panel; Future - Lipid panel; Future - Lipid panel - Comprehensive metabolic panel  3. Stiff neck Start with spine xray; consider additional imaging/evaluation pending this. Do feel that there is msk component contributing to ongoing symptoms.  - DG Cervical Spine Complete; Future - DG Thoracic Spine W/Swimmers; Future  4. Hyperglycemia - Hemoglobin A1c; Future - Hemoglobin A1c  5. Neuropathy - CBC with Differential/Platelet; Future - DG  Cervical Spine Complete; Future - DG Thoracic Spine W/Swimmers; Future - CBC with Differential/Platelet  Return for pending xray.    Theodis Shove, MD

## 2021-06-11 ENCOUNTER — Other Ambulatory Visit: Payer: Self-pay | Admitting: Family Medicine

## 2021-06-11 DIAGNOSIS — R937 Abnormal findings on diagnostic imaging of other parts of musculoskeletal system: Secondary | ICD-10-CM

## 2021-06-11 DIAGNOSIS — G629 Polyneuropathy, unspecified: Secondary | ICD-10-CM

## 2021-06-11 DIAGNOSIS — M549 Dorsalgia, unspecified: Secondary | ICD-10-CM

## 2021-06-27 ENCOUNTER — Other Ambulatory Visit: Payer: Self-pay

## 2021-06-27 ENCOUNTER — Ambulatory Visit
Admission: RE | Admit: 2021-06-27 | Discharge: 2021-06-27 | Disposition: A | Discharge status: Home / Self Care | Payer: 59 | Source: Ambulatory Visit | Attending: Family Medicine | Admitting: Family Medicine

## 2021-06-27 DIAGNOSIS — M549 Dorsalgia, unspecified: Secondary | ICD-10-CM

## 2021-06-27 DIAGNOSIS — G629 Polyneuropathy, unspecified: Secondary | ICD-10-CM

## 2021-06-27 DIAGNOSIS — R937 Abnormal findings on diagnostic imaging of other parts of musculoskeletal system: Secondary | ICD-10-CM

## 2021-07-02 ENCOUNTER — Encounter: Payer: Self-pay | Admitting: Family Medicine

## 2021-07-02 ENCOUNTER — Other Ambulatory Visit: Payer: Self-pay | Admitting: *Deleted

## 2021-07-02 DIAGNOSIS — M5020 Other cervical disc displacement, unspecified cervical region: Secondary | ICD-10-CM

## 2021-07-02 NOTE — Telephone Encounter (Signed)
See results note. 

## 2022-03-19 LAB — HM COLONOSCOPY

## 2022-03-20 ENCOUNTER — Encounter: Payer: Self-pay | Admitting: Family Medicine

## 2022-03-24 ENCOUNTER — Ambulatory Visit: Payer: Self-pay | Admitting: Family Medicine

## 2022-04-15 ENCOUNTER — Ambulatory Visit (INDEPENDENT_AMBULATORY_CARE_PROVIDER_SITE_OTHER): Payer: 59 | Admitting: Family Medicine

## 2022-04-15 ENCOUNTER — Encounter: Payer: Self-pay | Admitting: Family Medicine

## 2022-04-15 VITALS — BP 90/62 | HR 85 | Temp 98.4°F | Ht 71.25 in | Wt 197.4 lb

## 2022-04-15 DIAGNOSIS — I10 Essential (primary) hypertension: Secondary | ICD-10-CM

## 2022-04-15 DIAGNOSIS — E782 Mixed hyperlipidemia: Secondary | ICD-10-CM

## 2022-04-15 NOTE — Progress Notes (Unsigned)
   Established Patient Office Visit  Subjective   Patient ID: Wesley Fletcher, male    DOB: September 21, 1975  Age: 46 y.o. MRN: [id number]  Chief Complaint  Patient presents with   Establish Care    Here for transition of care visit. States that he recently had colonoscopy and had 1 polyp removed but it was benign, follow up in 10 years.   Patient has a former history of HTN, HLD, states that these problems have resolved essentially since he lost about 60 pounds. States that he will sometimes have some grogginess at work, occasionally in the afternoon but can be at other times. States that he is usually getting around 6 hours of sleep at night. Sometimes feels that he is not getting a restful sleep. Reports no excessive snoring, no frequent nighttime awakenings. States that he goes to bed around midnight and wakes up at 5:30 am.    Current Outpatient Medications  Medication Instructions   OVER THE COUNTER MEDICATION Mushroom complex daily   [facility] PO Take by mouth.    Patient Active Problem List   Diagnosis Date Noted   Hyperlipidemia 12/06/2018   HTN (hypertension)    GERD (gastroesophageal reflux disease) 10/26/2013      Review of Systems  All other systems reviewed and are negative.     Objective:     BP 90/62 (BP Location: Right Arm, Patient Position: Sitting, Cuff Size: Large)   Pulse 85   Temp 98.4 F (36.9 C) (Oral)   Ht 5' 11.25" (1.81 m)   Wt 197 lb 6.4 oz (89.5 kg)   SpO2 99%   BMI 27.34 kg/m  {Vitals History (Optional):23777}  Physical Exam Vitals reviewed.  Constitutional:      Appearance: Normal appearance. He is well-groomed and normal weight.  HENT:     Head: Normocephalic and atraumatic.  Eyes:     Extraocular Movements: Extraocular movements intact.     Conjunctiva/sclera: Conjunctivae normal.     Pupils: Pupils are equal, round, and reactive to light.  Cardiovascular:     Rate and Rhythm: Normal rate and regular rhythm.     Pulses: Normal  pulses.     Heart sounds: S1 normal and S2 normal. No murmur heard. Pulmonary:     Effort: Pulmonary effort is normal.     Breath sounds: Normal breath sounds and air entry. No rales.  Abdominal:     General: Abdomen is flat. Bowel sounds are normal.     Tenderness: There is no abdominal tenderness.  Neurological:     General: No focal deficit present.     Mental Status: He is alert and oriented to person, place, and time.     Gait: Gait is intact.  Psychiatric:        Mood and Affect: Mood and affect normal.      No results found for any visits on 04/15/22.  {Labs (Optional):23779}  The 10-year ASCVD risk score (Arnett DK, et al., 2019) is: 0.8%    Assessment & Plan:   Problem List Items Addressed This Visit       Other   Hyperlipidemia - Primary   Relevant Orders   CMP   Lipid Panel    No follow-ups on file.    Farrel Conners, MD

## 2022-04-16 NOTE — Assessment & Plan Note (Signed)
BP normal today, not on any medications, this problems appears resolved with his weight loss in the past.

## 2022-04-16 NOTE — Assessment & Plan Note (Signed)
Patient is due for his annual labs today, checking lipid panel and CMP.

## 2022-06-16 ENCOUNTER — Encounter: Payer: Self-pay | Admitting: Family Medicine

## 2022-06-16 ENCOUNTER — Other Ambulatory Visit (INDEPENDENT_AMBULATORY_CARE_PROVIDER_SITE_OTHER): Payer: 59

## 2022-06-16 DIAGNOSIS — E782 Mixed hyperlipidemia: Secondary | ICD-10-CM

## 2022-06-16 LAB — COMPREHENSIVE METABOLIC PANEL
ALT: 11 U/L (ref 0–53)
AST: 11 U/L (ref 0–37)
Albumin: 4.5 g/dL (ref 3.5–5.2)
Alkaline Phosphatase: 28 U/L — ABNORMAL LOW (ref 39–117)
BUN: 19 mg/dL (ref 6–23)
CO2: 29 mEq/L (ref 19–32)
Calcium: 9.4 mg/dL (ref 8.4–10.5)
Chloride: 102 mEq/L (ref 96–112)
Creatinine, Ser: 1.04 mg/dL (ref 0.40–1.50)
GFR: 87.28 mL/min (ref >60.00)
Glucose, Bld: 99 mg/dL (ref 70–99)
Potassium: 3.9 mEq/L (ref 3.5–5.1)
Sodium: 139 mEq/L (ref 135–145)
Total Bilirubin: 0.6 mg/dL (ref 0.2–1.2)
Total Protein: 6.8 g/dL (ref 6.0–8.3)

## 2022-06-16 LAB — LIPID PANEL
Cholesterol: 191 mg/dL (ref 0–200)
HDL: 56 mg/dL (ref >39.00)
LDL Cholesterol: 100 mg/dL — ABNORMAL HIGH (ref 0–99)
NonHDL: 134.5
Total CHOL/HDL Ratio: 3
Triglycerides: 174 mg/dL — ABNORMAL HIGH (ref 0.0–149.0)
VLDL: 34.8 mg/dL (ref 0.0–40.0)

## 2022-09-03 ENCOUNTER — Encounter: Payer: Self-pay | Admitting: Family Medicine

## 2022-09-03 ENCOUNTER — Ambulatory Visit (INDEPENDENT_AMBULATORY_CARE_PROVIDER_SITE_OTHER): Payer: 59 | Admitting: Family Medicine

## 2022-09-03 VITALS — BP 102/76 | HR 69 | Temp 98.1°F | Wt 194.0 lb

## 2022-09-03 DIAGNOSIS — J029 Acute pharyngitis, unspecified: Secondary | ICD-10-CM

## 2022-09-03 LAB — POCT RAPID STREP A (OFFICE): Rapid Strep A Screen: NEGATIVE

## 2022-09-03 LAB — POCT INFLUENZA A/B
Influenza A, POC: NEGATIVE
Influenza B, POC: NEGATIVE

## 2022-09-03 LAB — POC COVID19 BINAXNOW: SARS Coronavirus 2 Ag: NEGATIVE

## 2022-09-03 MED ORDER — CEFUROXIME AXETIL 500 MG PO TABS: 500.0000 mg | ORAL_TABLET | Freq: Two times a day (BID) | ORAL | 0 refills | Status: AC

## 2022-09-03 NOTE — Progress Notes (Signed)
   Subjective:    Patient ID: Wesley Fletcher, male    DOB: June 19, 1976, 47 y.o.   MRN: [id number]  HPI Here for 2 days of fatigue, ST, PND, and a dry cough. No fever or HA or body aches. His wife and son are both sick with a combination of strep and flu.    Review of Systems  Constitutional:  Positive for fatigue. Negative for fever.  HENT:  Positive for postnasal drip and sore throat. Negative for congestion, ear pain and sinus pressure.   Eyes: Negative.   Respiratory:  Positive for cough. Negative for shortness of breath and wheezing.   Gastrointestinal: Negative.        Objective:   Physical Exam Constitutional:      Appearance: Normal appearance.  HENT:     Right Ear: Tympanic membrane, ear canal and external ear normal.     Left Ear: Tympanic membrane, ear canal and external ear normal.     Nose: Nose normal.     Mouth/Throat:     Pharynx: Posterior oropharyngeal erythema present. No oropharyngeal exudate.  Eyes:     Conjunctiva/sclera: Conjunctivae normal.  Pulmonary:     Effort: Pulmonary effort is normal.     Breath sounds: Normal breath sounds.  Neurological:     Mental Status: He is alert.           Assessment & Plan:  This is likely a strep pharyngitis, and we will treat with 10 days of Cefuroxime.  Alysia Penna, MD

## 2022-09-29 ENCOUNTER — Encounter: Payer: Self-pay | Admitting: Family Medicine

## 2022-09-29 ENCOUNTER — Ambulatory Visit (INDEPENDENT_AMBULATORY_CARE_PROVIDER_SITE_OTHER): Payer: 59 | Admitting: Family Medicine

## 2022-09-29 VITALS — BP 120/82 | HR 80 | Temp 98.0°F | Wt 194.0 lb

## 2022-09-29 DIAGNOSIS — J029 Acute pharyngitis, unspecified: Secondary | ICD-10-CM | POA: Diagnosis not present

## 2022-09-29 DIAGNOSIS — J020 Streptococcal pharyngitis: Secondary | ICD-10-CM

## 2022-09-29 LAB — POC COVID19 BINAXNOW: SARS Coronavirus 2 Ag: NEGATIVE

## 2022-09-29 LAB — POCT RAPID STREP A (OFFICE): Rapid Strep A Screen: POSITIVE — AB

## 2022-09-29 MED ORDER — CEFUROXIME AXETIL 500 MG PO TABS: 500.0000 mg | ORAL_TABLET | Freq: Two times a day (BID) | ORAL | 0 refills | Status: AC

## 2022-09-29 NOTE — Progress Notes (Signed)
   Subjective:    Patient ID: Wesley Fletcher, male    DOB: [birth year], 47 y.o.   MRN: [id number]  HPI Here for 4 days of what he thinks is another strep infection. We saw him on 2 -28-24 for strep, and he took 10 days of Cefuroxime. He says he completely recovered but one of his children has been coughing. He has ST and hoarseness.    Review of Systems  Constitutional: Negative.   HENT:  Positive for congestion, postnasal drip and sore throat. Negative for ear pain and sinus pain.   Eyes: Negative.   Respiratory: Negative.         Objective:   Physical Exam Constitutional:      Appearance: Normal appearance.  HENT:     Right Ear: Tympanic membrane, ear canal and external ear normal.     Left Ear: Tympanic membrane, ear canal and external ear normal.     Nose: Nose normal.     Mouth/Throat:     Pharynx: Oropharynx is clear.  Eyes:     Conjunctiva/sclera: Conjunctivae normal.  Pulmonary:     Effort: Pulmonary effort is normal.     Breath sounds: Normal breath sounds.  Neurological:     Mental Status: He is alert.           Assessment & Plan:  He has another bout of strep pharyngitis. We will treat him again with 10 days of Cefuroxime. He plans to take his daughter to her pediatrician to be treated.  Alysia Penna, MD

## 2023-07-24 IMAGING — MR MR CERVICAL SPINE W/O CM
5 series · 33 of 48 positions shown · non-contrast
Comparison: Radiographs of the cervical spine 06/10/2021.

CLINICAL DATA: Neuropathy. Upper back pain. Abnormal x-ray of
cervical spine. Abnormal cervical spine x-ray; intermittent
neuropathy upper back/chest. Additional history provided by scanning
technologist: Patient reports recurring left-sided neck pain for 4
months.

EXAM:
MRI CERVICAL SPINE WITHOUT CONTRAST
TECHNIQUE: Multiplanar, multisequence MR imaging of the cervical spine was
performed. No intravenous contrast was administered.

[Series 2: T2 · sagittal · 3.0mm · 0.41mm/px · 8 of 17 slices shown (1 of 2)]
[im 1/17]
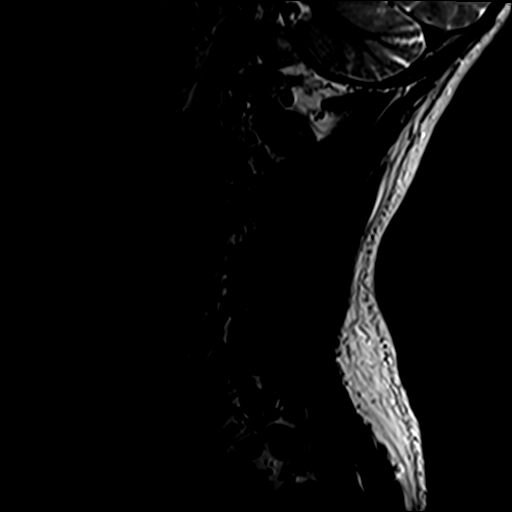
[im 3/17]
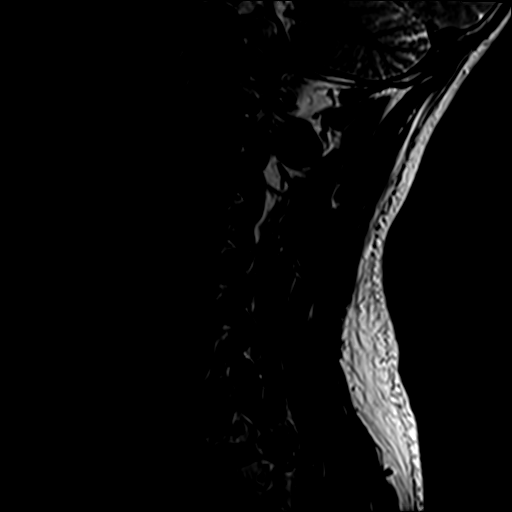
[im 5/17]
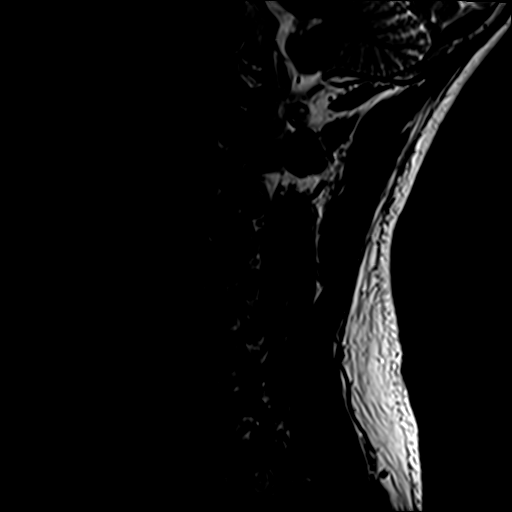
[im 7/17]
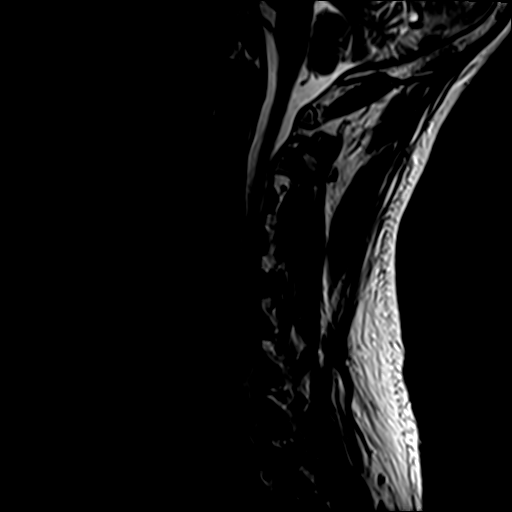
[im 10/17]
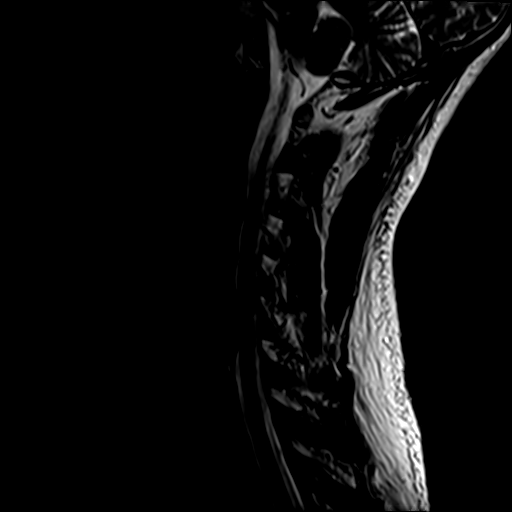
[im 12/17]
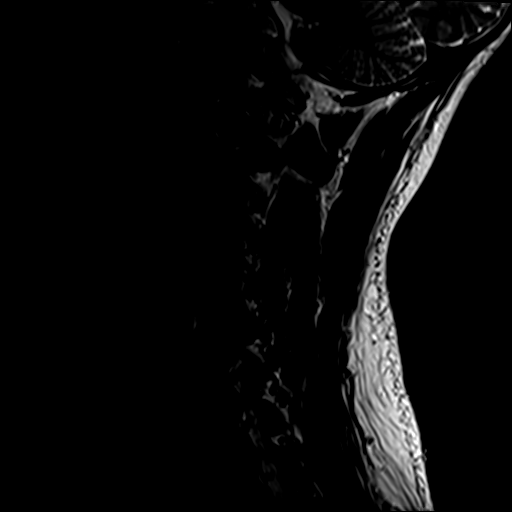
[im 14/17]
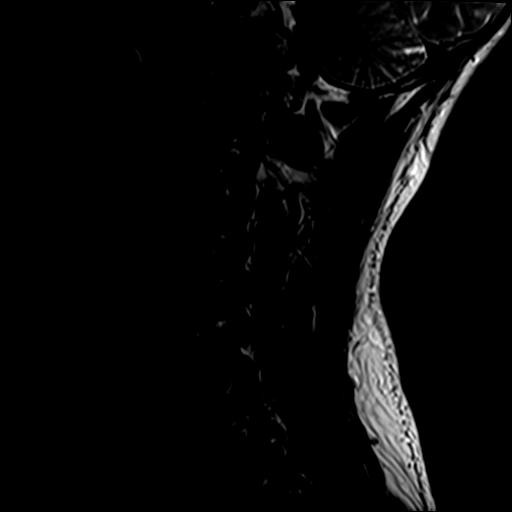
[im 17/17]
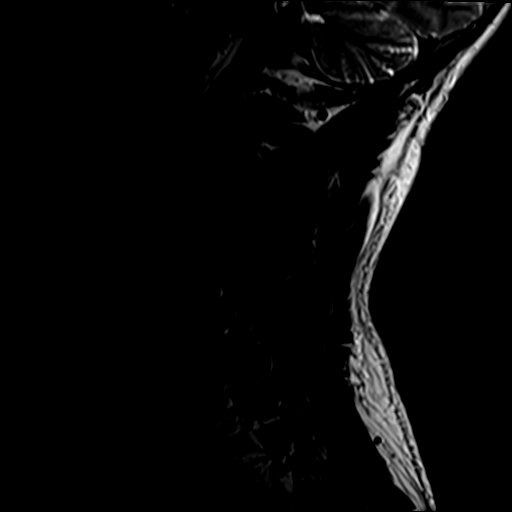

[Series 3: STIR · sagittal · 3.0mm · 0.82mm/px · 7 of 17 slices shown]
[im 1/17]
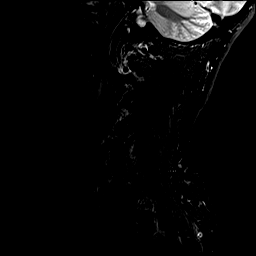
[im 3/17]
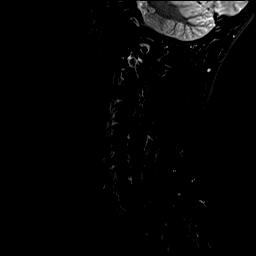
[im 6/17]
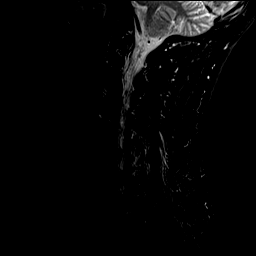
[im 9/17]
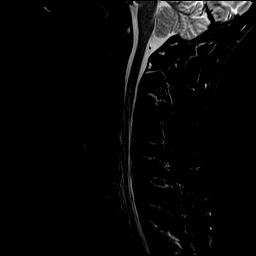
[im 11/17]
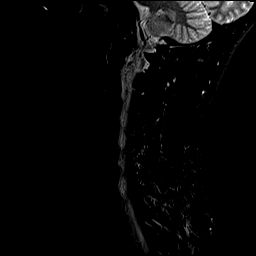
[im 14/17]
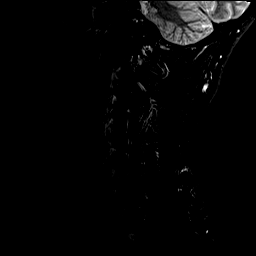
[im 17/17]
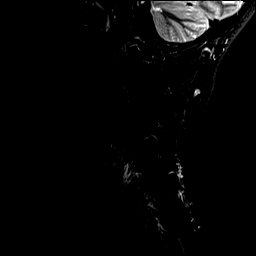

[Series 4: T1 · sagittal · 3.0mm · 0.82mm/px · 7 of 17 slices shown]
[im 1/17]
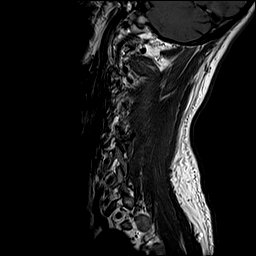
[im 3/17]
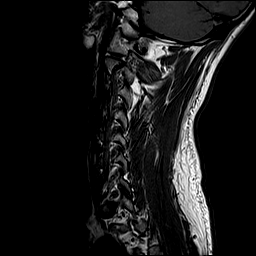
[im 6/17]
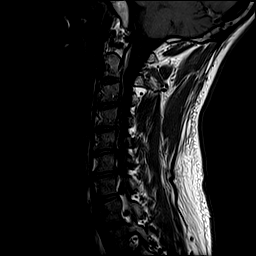
[im 9/17]
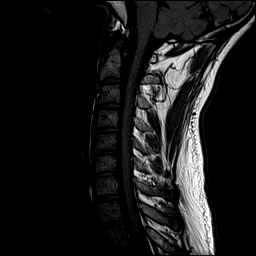
[im 11/17]
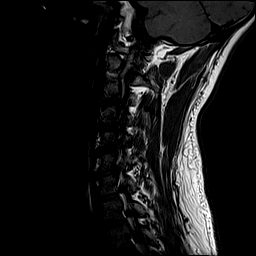
[im 14/17]
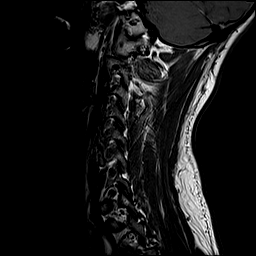
[im 17/17]
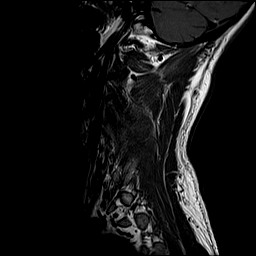

[Series 5: T2 · axial · 3.0mm · 0.70mm/px · z∈[-52,+57]mm · 9 of 30 slices shown (2 of 2)]
[im 1/30]
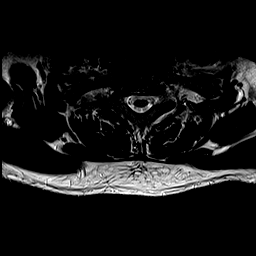
[im 5/30]
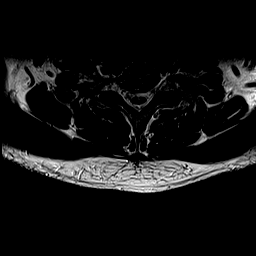
[im 10/30]
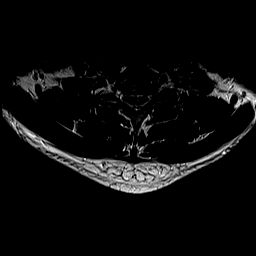
[im 13/30]
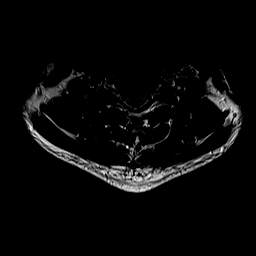
[im 15/30]
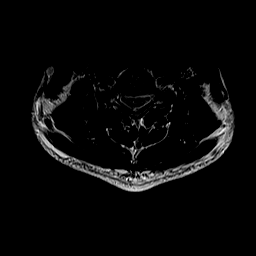
[im 17/30]
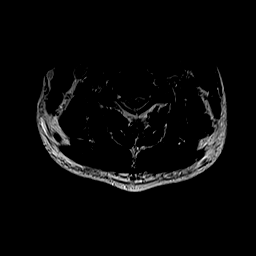
[im 20/30]
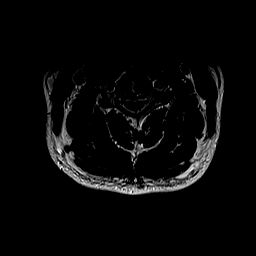
[im 25/30]
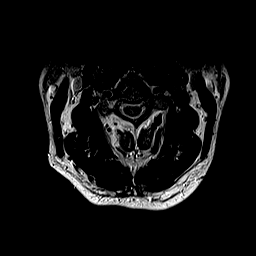
[im 30/30]
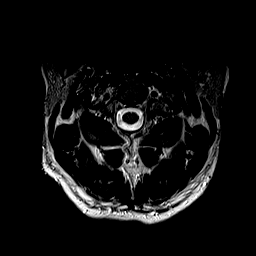

[Series 6: GRE · axial · 3.0mm · 0.35mm/px · z∈[-52,-37]mm · 2 of 30 slices shown]
[im 1/30]
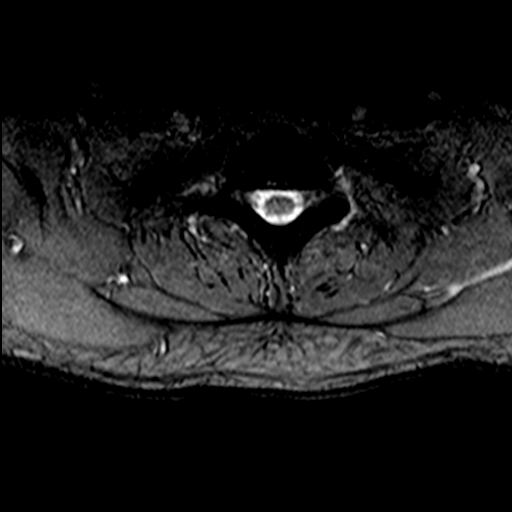
[im 5/30]
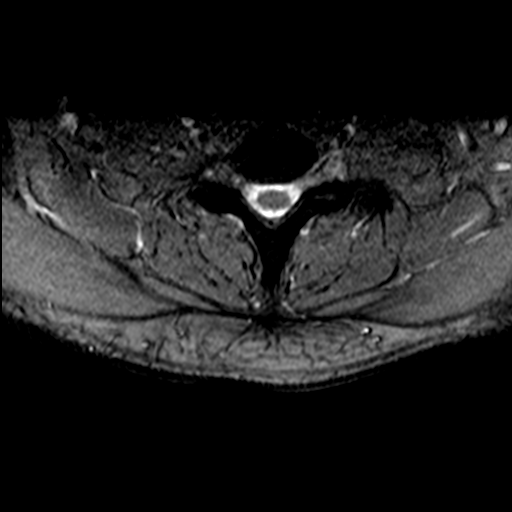

[33 of 48 positions shown; findings below may reference images not displayed]

FINDINGS: Intermittently motion degraded examination. Most notably, there is
moderate/severe motion degradation of the sagittal STIR sequence.

Alignment: Straightening of the expected cervical lordosis. No
significant spondylolisthesis.

Vertebrae: Vertebral body height is maintained. Within the
limitations of motion degradation, no significant marrow edema or
focal suspicious osseous lesion is identified.

Cord: No signal abnormality identified within the cervical spinal
cord.

Posterior Fossa, vertebral arteries, paraspinal tissues: No
abnormality identified within included portions of the posterior
fossa. Flow voids preserved within the imaged cervical vertebral
arteries. Paraspinal soft tissues unremarkable.

Disc levels:

Mild-to-moderate disc degeneration at C5-C6 and C6-C7. No more than
mild disc degeneration at the remaining levels.

Congenitally narrow cervical spinal canal.

C2-C3: No significant disc herniation or stenosis.

C3-C4: Small central disc protrusion. Uncovertebral hypertrophy on
the left. The disc protrusion results in mild focal effacement of
the ventral thecal sac, with contact upon the ventral spinal cord.
Mild relative left neural foraminal narrowing.

C4-C5: Shallow disc bulge. Superimposed small central disc
protrusion. Bilateral uncovertebral hypertrophy. The disc protrusion
results in mild focal effacement of the ventral thecal sac with
likely contact upon the ventral spinal cord. Bilateral neural
foraminal narrowing (mild right, moderate left).

C5-C6: Disc bulge. Superimposed broad-based center/right center disc
protrusion. Bilateral uncovertebral hypertrophy. The disc protrusion
effaces the ventral thecal sac, contacting and mildly flattening the
ventral aspect of the spinal cord. However, the dorsal CSF space is
maintained within the spinal canal. Mild left neural foraminal
narrowing.

C6-C7: Disc bulge. Superimposed broad-based left center/foraminal
disc protrusion. The disc protrusion mildly effaces the ventral
thecal sac, contacting the ventral spinal cord. Bilateral neural
foraminal narrowing (mild right, moderate left).

C7-T1: Mild facet arthrosis. No significant disc herniation or
stenosis.
IMPRESSION: Motion degraded examination, as described.

Cervical spondylosis, as outlined and with findings are most notably
as follows.

At C5-C6, a broad-based center/right center disc protrusion effaces
the ventral thecal sac, contacting and mildly flattening the ventral
aspect of the spinal cord. However, the dorsal CSF space is
maintained within the spinal canal. Multifactorial mild left neural
foraminal narrowing also present at this level.

No more than mild spinal canal narrowing at the remaining levels.
Additional sites of foraminal stenosis, as detailed and greatest on
the left at C4-C5 (moderate) and on the left at C6-C7 (moderate).

Straightening of the expected cervical lordosis.

## 2023-09-02 ENCOUNTER — Ambulatory Visit (INDEPENDENT_AMBULATORY_CARE_PROVIDER_SITE_OTHER): Payer: Self-pay | Admitting: Family Medicine

## 2023-09-02 ENCOUNTER — Ambulatory Visit: Payer: 59 | Admitting: Family Medicine

## 2023-09-02 ENCOUNTER — Encounter: Payer: Self-pay | Admitting: Family Medicine

## 2023-09-02 VITALS — BP 90/62 | HR 70 | Temp 98.3°F | Ht 71.25 in | Wt 210.9 lb

## 2023-09-02 DIAGNOSIS — Z0001 Encounter for general adult medical examination with abnormal findings: Secondary | ICD-10-CM | POA: Diagnosis not present

## 2023-09-02 DIAGNOSIS — Z1159 Encounter for screening for other viral diseases: Secondary | ICD-10-CM

## 2023-09-02 DIAGNOSIS — R635 Abnormal weight gain: Secondary | ICD-10-CM

## 2023-09-02 DIAGNOSIS — Z13220 Encounter for screening for lipoid disorders: Secondary | ICD-10-CM | POA: Diagnosis not present

## 2023-09-02 LAB — COMPREHENSIVE METABOLIC PANEL
ALT: 28 U/L (ref 0–53)
AST: 16 U/L (ref 0–37)
Albumin: 4.2 g/dL (ref 3.5–5.2)
Alkaline Phosphatase: 42 U/L (ref 39–117)
BUN: 18 mg/dL (ref 6–23)
CO2: 28 meq/L (ref 19–32)
Calcium: 9 mg/dL (ref 8.4–10.5)
Chloride: 102 meq/L (ref 96–112)
Creatinine, Ser: 0.88 mg/dL (ref 0.40–1.50)
GFR: 103.63 mL/min (ref >60.00)
Glucose, Bld: 94 mg/dL (ref 70–99)
Potassium: 4.1 meq/L (ref 3.5–5.1)
Sodium: 138 meq/L (ref 135–145)
Total Bilirubin: 0.5 mg/dL (ref 0.2–1.2)
Total Protein: 6.7 g/dL (ref 6.0–8.3)

## 2023-09-02 LAB — LIPID PANEL
Cholesterol: 196 mg/dL (ref 0–200)
HDL: 50.5 mg/dL
LDL Cholesterol: 131 mg/dL — ABNORMAL HIGH (ref 0–99)
NonHDL: 145.77
Total CHOL/HDL Ratio: 4
Triglycerides: 72 mg/dL (ref 0.0–149.0)
VLDL: 14.4 mg/dL (ref 0.0–40.0)

## 2023-09-02 LAB — CBC WITH DIFFERENTIAL/PLATELET
Basophils Absolute: 0 K/uL (ref 0.0–0.1)
Basophils Relative: 0.6 % (ref 0.0–3.0)
Eosinophils Absolute: 0.1 K/uL (ref 0.0–0.7)
Eosinophils Relative: 1.8 % (ref 0.0–5.0)
HCT: 41 % (ref 39.0–52.0)
Hemoglobin: 14.2 g/dL (ref 13.0–17.0)
Lymphocytes Relative: 28.6 % (ref 12.0–46.0)
Lymphs Abs: 1.4 K/uL (ref 0.7–4.0)
MCHC: 34.5 g/dL (ref 30.0–36.0)
MCV: 83.7 fl (ref 78.0–100.0)
Monocytes Absolute: 0.4 K/uL (ref 0.1–1.0)
Monocytes Relative: 7.2 % (ref 3.0–12.0)
Neutro Abs: 3.1 K/uL (ref 1.4–7.7)
Neutrophils Relative %: 61.8 % (ref 43.0–77.0)
Platelets: 319 K/uL (ref 150.0–400.0)
RBC: 4.9 Mil/uL (ref 4.22–5.81)
RDW: 13.1 % (ref 11.5–15.5)
WBC: 5.1 K/uL (ref 4.0–10.5)

## 2023-09-02 LAB — HEMOGLOBIN A1C: Hgb A1c MFr Bld: 5.8 % (ref 4.6–6.5)

## 2023-09-02 LAB — TSH: TSH: 0.95 u[IU]/mL (ref 0.35–5.50)

## 2023-09-02 NOTE — Patient Instructions (Addendum)
 Tetanus booster will be due after January 29, 2024   Debrox ear drops 2-3 drops twice a week to help reduce wax buildup  Health Maintenance, Male Adopting a healthy lifestyle and getting preventive care are important in promoting health and wellness. Ask your health care provider about: The right schedule for you to have regular tests and exams. Things you can do on your own to prevent diseases and keep yourself healthy. What should I know about diet, weight, and exercise? Eat a healthy diet  Eat a diet that includes plenty of vegetables, fruits, low-fat dairy products, and lean protein. Do not eat a lot of foods that are high in solid fats, added sugars, or sodium. Maintain a healthy weight Body mass index (BMI) is a measurement that can be used to identify possible weight problems. It estimates body fat based on height and weight. Your health care provider can help determine your BMI and help you achieve or maintain a healthy weight. Get regular exercise Get regular exercise. This is one of the most important things you can do for your health. Most adults should: Exercise for at least 150 minutes each week. The exercise should increase your heart rate and make you sweat (moderate-intensity exercise). Do strengthening exercises at least twice a week. This is in addition to the moderate-intensity exercise. Spend less time sitting. Even light physical activity can be beneficial. Watch cholesterol and blood lipids Have your blood tested for lipids and cholesterol at 46 years of age, then have this test every 5 years. You may need to have your cholesterol levels checked more often if: Your lipid or cholesterol levels are high. You are older than 46 years of age. You are at high risk for heart disease. What should I know about cancer screening? Many types of cancers can be detected early and may often be prevented. Depending on your health history and family history, you may need to have cancer  screening at various ages. This may include screening for: Colorectal cancer. Prostate cancer. Skin cancer. Lung cancer. What should I know about heart disease, diabetes, and high blood pressure? Blood pressure and heart disease High blood pressure causes heart disease and increases the risk of stroke. This is more likely to develop in people who have high blood pressure readings or are overweight. Talk with your health care provider about your target blood pressure readings. Have your blood pressure checked: Every 3-5 years if you are 51-12 years of age. Every year if you are 47 years old or older. If you are between the ages of 52 and 51 and are a current or former smoker, ask your health care provider if you should have a one-time screening for abdominal aortic aneurysm (AAA). Diabetes Have regular diabetes screenings. This checks your fasting blood sugar level. Have the screening done: Once every three years after age 72 if you are at a normal weight and have a low risk for diabetes. More often and at a younger age if you are overweight or have a high risk for diabetes. What should I know about preventing infection? Hepatitis B If you have a higher risk for hepatitis B, you should be screened for this virus. Talk with your health care provider to find out if you are at risk for hepatitis B infection. Hepatitis C Blood testing is recommended for: Everyone born from [birth year] through 1963. Anyone with known risk factors for hepatitis C. Sexually transmitted infections (STIs) You should be screened each year for STIs, including gonorrhea  and chlamydia, if: You are sexually active and are younger than 46 years of age. You are older than 46 years of age and your health care provider tells you that you are at risk for this type of infection. Your sexual activity has changed since you were last screened, and you are at increased risk for chlamydia or gonorrhea. Ask your health care provider if  you are at risk. Ask your health care provider about whether you are at high risk for HIV. Your health care provider may recommend a prescription medicine to help prevent HIV infection. If you choose to take medicine to prevent HIV, you should first get tested for HIV. You should then be tested every 3 months for as long as you are taking the medicine. Follow these instructions at home: Alcohol use Do not drink alcohol if your health care provider tells you not to drink. If you drink alcohol: Limit how much you have to 0-2 drinks a day. Know how much alcohol is in your drink. In the [place] one drink equals one 12 oz bottle of beer (355 mL), one 5 oz glass of wine (148 mL), or one 1 oz glass of hard liquor (44 mL). Lifestyle Do not use any products that contain nicotine or tobacco. These products include cigarettes, chewing tobacco, and vaping devices, such as e-cigarettes. If you need help quitting, ask your health care provider. Do not use street drugs. Do not share needles. Ask your health care provider for help if you need support or information about quitting drugs. General instructions Schedule regular health, dental, and eye exams. Stay current with your vaccines. Tell your health care provider if: You often feel depressed. You have ever been abused or do not feel safe at home. Summary Adopting a healthy lifestyle and getting preventive care are important in promoting health and wellness. Follow your health care provider's instructions about healthy diet, exercising, and getting tested or screened for diseases. Follow your health care provider's instructions on monitoring your cholesterol and blood pressure. This information is not intended to replace advice given to you by your health care provider. Make sure you discuss any questions you have with your health care provider. Document Revised: 11/12/2020 Document Reviewed: 11/12/2020 Elsevier Patient Education  2024 [employer].

## 2023-09-02 NOTE — Progress Notes (Signed)
 Complete physical exam  Patient: Wesley Fletcher   DOB: 05/02/1976   47 y.o. Male  MRN: [id number]  Subjective:    Chief Complaint  Patient presents with   Annual Exam    Wesley Fletcher is a 48 y.o. male who presents today for a complete physical exam. He reports consuming a general diet. The patient does not participate in regular exercise at present. He generally feels well. He reports sleeping well. He does not have additional problems to discuss today.    Most recent fall risk assessment:    09/03/2022    3:14 PM  Fall Risk   Falls in the past year? 0  Number falls in past yr: 0  Injury with Fall? 0  Risk for fall due to : No Fall Risks  Follow up Falls evaluation completed     Most recent depression screenings:    09/02/2023   10:09 AM 09/03/2022    3:14 PM  PHQ 2/9 Scores  PHQ - 2 Score 0 0  PHQ- 9 Score  0    Vision:Within last year and Dental: No current dental problems and Receives regular dental care  Patient Active Problem List   Diagnosis Date Noted   Hyperlipidemia 12/06/2018   HTN (hypertension)    GERD (gastroesophageal reflux disease) 10/26/2013      Patient Care Team: Karie Georges, MD as PCP - General (Family Medicine)    Review of Systems  HENT:  Negative for hearing loss.   Eyes:  Negative for blurred vision.  Respiratory:  Negative for shortness of breath.   Cardiovascular:  Negative for chest pain.  Gastrointestinal: Negative.   Genitourinary: Negative.   Musculoskeletal:  Negative for back pain.  Neurological:  Negative for headaches.  Psychiatric/Behavioral:  Negative for depression.        Objective:     BP 90/62   Pulse 70   Temp 98.3 F (36.8 C) (Oral)   Ht 5' 11.25" (1.81 m)   Wt 210 lb 14.4 oz (95.7 kg)   SpO2 99%   BMI 29.21 kg/m  BP Readings from Last 3 Encounters:  09/02/23 90/62  09/29/22 120/82  09/03/22 102/76      Physical Exam Vitals reviewed.  Constitutional:      Appearance: Normal  appearance. He is well-groomed and normal weight.  HENT:     Right Ear: There is impacted cerumen.     Left Ear: There is impacted cerumen.     Mouth/Throat:     Mouth: Mucous membranes are moist.     Pharynx: No posterior oropharyngeal erythema.  Eyes:     Extraocular Movements: Extraocular movements intact.     Conjunctiva/sclera: Conjunctivae normal.  Neck:     Thyroid: No thyromegaly.  Cardiovascular:     Rate and Rhythm: Normal rate and regular rhythm.     Heart sounds: S1 normal and S2 normal. No murmur heard. Pulmonary:     Effort: Pulmonary effort is normal.     Breath sounds: Normal breath sounds and air entry. No rales.  Abdominal:     General: Abdomen is flat. Bowel sounds are normal.  Musculoskeletal:     Right lower leg: No edema.     Left lower leg: No edema.  Lymphadenopathy:     Cervical: No cervical adenopathy.  Neurological:     General: No focal deficit present.     Mental Status: He is alert and oriented to person, place, and time.     Gait: Gait  is intact.  Psychiatric:        Mood and Affect: Mood and affect normal.      No results found for any visits on 09/02/23.     Assessment & Plan:    Routine Health Maintenance and Physical Exam  Immunization History  Administered Date(s) Administered   PFIZER(Purple Top)SARS-COV-2 Vaccination 04/19/2020   Tdap 01/26/2014    Health Maintenance  Topic Date Due   COVID-19 Vaccine (2 - 2024-25 season) 03/08/2023   INFLUENZA VACCINE  10/05/2023 (Originally 02/05/2023)   Hepatitis C Screening  09/01/2024 (Originally 11/13/1995)   DTaP/Tdap/Td (2 - Td or Tdap) 01/27/2024   Colonoscopy  03/19/2032   HIV Screening  Completed   HPV VACCINES  Aged Out    Discussed health benefits of physical activity, and encouraged him to engage in regular exercise appropriate for his age and condition.  Lipid screening -     Lipid panel; Future  Weight gain -     Hemoglobin A1c; Future -     TSH; Future  Need for  hepatitis C screening test -     Hepatitis C antibody; Future  Encounter for general adult medical examination with abnormal findings -     Comprehensive metabolic panel; Future -     CBC with Differential/Platelet; Future  Physical exam is normal except BL cerumen impactions, we attempted manual and irrigation debridement however pt could not tolerate due to pain. I advised he use Debrox ear drops to help reduce the wax buildup. If he continues to have issues we could have him back for another irrigation. Labs ordered for annual surveillance. Counseled patient on increasing exercise to 150 minutes per week, handouts given on healthy eating and exericse.   Return in 1 year (on 09/01/2024) for annual physical exam.     Karie Georges, MD

## 2023-09-03 LAB — HEPATITIS C ANTIBODY: Hepatitis C Ab: NONREACTIVE

## 2023-09-06 ENCOUNTER — Encounter: Payer: Self-pay | Admitting: Family Medicine

## 2023-12-18 ENCOUNTER — Ambulatory Visit: Payer: Self-pay

## 2023-12-18 ENCOUNTER — Telehealth: Admitting: Physician Assistant

## 2023-12-18 DIAGNOSIS — H539 Unspecified visual disturbance: Secondary | ICD-10-CM

## 2023-12-18 DIAGNOSIS — R410 Disorientation, unspecified: Secondary | ICD-10-CM

## 2023-12-18 DIAGNOSIS — R519 Headache, unspecified: Secondary | ICD-10-CM

## 2023-12-18 NOTE — Telephone Encounter (Signed)
 FYI Only or Action Required?: FYI only for provider  Patient was last seen in primary care on 09/02/2023 by Aida House, MD. Called Nurse Triage reporting Altered Mental Status, Headache, and Visual Field Change. Symptoms began a week ago. Interventions attempted: Rest, hydration, or home remedies. Symptoms are: unchanged.  Triage Disposition: Go to ED Now (Notify PCP)  Patient/caregiver understands and will follow disposition?: Yes                Copied from CRM [id number]. Topic: Clinical - Red Word Triage >> Dec 18, 2023  2:15 PM Shardie S wrote: [facility] that prompted transfer to Nurse Triage: blurred vision, headaches, confusion Reason for Disposition  Headache  (and neurologic deficit)  Answer Assessment - Initial Assessment Questions Pt has had two episodes in the last week of visual changes to one eye with a H/A to the opposite side of his head. Yesterday pt had an episode of confusion in which for 5 minutes he felt like he could not think very well and would lose his train of thought. Pt currently in [place]. RN advised pt he should be seen by a HCP where he is at this time and advised ED. Pt states he has a meeting but will go to the ED afterwards.  1. SYMPTOM: What is the main symptom you are concerned about? (e.g., weakness, numbness)     Visual change, headaches, confusion 2. ONSET: When did this start? (minutes, hours, days; while sleeping)     One week ago pt experienced an episode of visual change and H/A, and then 3 days ago it happened again. Confusion yesterday for 5 minutes. 3. LAST NORMAL: When was the last time you (the patient) were normal (no symptoms)?     1 wk 4. PATTERN Does this come and go, or has it been constant since it started?  Is it present now?     Comes and goes  5. CARDIAC SYMPTOMS: Have you had any of the following symptoms: chest pain, difficulty breathing, palpitations?     Felt like his heart started racing, denies CP  or SOB 6. NEUROLOGIC SYMPTOMS: Have you had any of the following symptoms: headache, dizziness, vision loss, double vision, changes in speech, unsteady on your feet?     Denies one-sided numbness or weakness; denies slurred speech when he was confused but it felt like I couldn't think very well, I would lose my train of thought, denies being unsteady on his feet; pt states he felt extremely awake when he was confused, like I had a boost of adrenaline, denies dizziness 7. OTHER SYMPTOMS: Do you have any other symptoms?      Zig zag pattern in his peripheral vision on one side, H/A dull and achy to the other side of his head -- couple of days later he had something similar in the other eye; thinks it may be an ocular migraine   Visual disturbance goes away, and then he gets a H/A  Able to walk normally.   Confusion happened yesterday for 5 minutes, separate from visual problem and H/A   I had high blood pressure but I changed my diet and it went away  Protocols used: Neurologic Deficit-A-AH

## 2023-12-18 NOTE — Progress Notes (Signed)
 Virtual Visit Consent   Amedee Cerrone, you are scheduled for a virtual visit with a [facility] provider today. Just as with appointments in the office, your consent must be obtained to participate. Your consent will be active for this visit and any virtual visit you may have with one of our providers in the next 365 days. If you have a MyChart account, a copy of this consent can be sent to you electronically.  As this is a virtual visit, video technology does not allow for your provider to perform a traditional examination. This may limit your provider's ability to fully assess your condition. If your provider identifies any concerns that need to be evaluated in person or the need to arrange testing (such as labs, EKG, etc.), we will make arrangements to do so. Although advances in technology are sophisticated, we cannot ensure that it will always work on either your end or our end. If the connection with a video visit is poor, the visit may have to be switched to a telephone visit. With either a video or telephone visit, we are not always able to ensure that we have a secure connection.  By engaging in this virtual visit, you consent to the provision of healthcare and authorize for your insurance to be billed (if applicable) for the services provided during this visit. Depending on your insurance coverage, you may receive a charge related to this service.  I need to obtain your verbal consent now. Are you willing to proceed with your visit today? Wesley Fletcher has provided verbal consent on 12/18/2023 for a virtual visit (video or telephone). Angelia Kelp, PA-C  Date: 12/18/2023 2:18 PM   Virtual Visit via Video Note   I, Angelia Kelp, connected with  Wesley Fletcher  ([id number], 07/11/1975) on 12/18/23 at  2:00 PM EDT by a video-enabled telemedicine application and verified that I am speaking with the correct person using two identifiers.  Location: Patient: Virtual Visit Location  Patient: Home Provider: Virtual Visit Location Provider: Home Office   I discussed the limitations of evaluation and management by telemedicine and the availability of in person appointments. The patient expressed understanding and agreed to proceed.    History of Present Illness: Wesley Fletcher is a 48 y.o. who identifies as a male who was assigned male at birth, and is being seen today for possible ocular migraine.  First occurrence happened a week ago had a line in the peripheral vision of the right eye. Line had zig-zag appearance. This visual change lasted for 10 minutes then developed a headache on the left. Headache was very dull and not the worst headache of his life.   Then a few days later he had the same issue but the visual disturbance happened in the left eye and the headache was on the right.   Yesterday he had an episode of confusion. He was at a conference table talking with others and had an episode of confusion and losing his train of thought. There was no associated headache or visual disturbance when this occurred.   He denies any numbness or weakness on any one side of the body. He denies any recent tremors or gait instability. He denies dizziness or lightheadedness.  There is no personal or familial history of any migraine disorder.   Had labs in March 2025 that were unremarkable, with exception of a borderline high LDL (131).  BP in March was slightly hypotensive at 90/62. No history of blood pressure issues.  He  takes no daily medications.  Grandfather had ALS.   Problems:  Patient Active Problem List   Diagnosis Date Noted   Hyperlipidemia 12/06/2018   HTN (hypertension)    GERD (gastroesophageal reflux disease) 10/26/2013    Allergies:  Allergies  Allergen Reactions   Pollen Extract Other (See Comments)    Sneezing,cough, congestion,rhinitis   Medications:  Current Outpatient Medications:    OVER THE COUNTER MEDICATION, Mushroom complex daily, Disp:  , Rfl:    [facility] JOHNS WORT PO, Take by mouth., Disp: , Rfl:   Current Facility-Administered Medications:    0.9 %  sodium chloride  infusion, 500 mL, Intravenous, Once, Danis, Roel Clarity III, MD  Observations/Objective: Patient is well-developed, well-nourished in no acute distress.  Resting comfortably at home.  Head is normocephalic, atraumatic.  No labored breathing.  Speech is clear and coherent with logical content.  Patient is alert and oriented at baseline.    Assessment and Plan: 1. New onset headache (Primary)  2. Visual disturbance  3. Confusion  - Advised with new concerns of potential ocular migraines and one episode of confusion with family history of ALS that he should seek further evaluation with his Primary Care office for further evaluation and discussion for potential imaging.  Follow Up Instructions: I discussed the assessment and treatment plan with the patient. The patient was provided an opportunity to ask questions and all were answered. The patient agreed with the plan and demonstrated an understanding of the instructions.  A copy of instructions were sent to the patient via MyChart unless otherwise noted below.   Patient has requested to receive PHI (AVS, Work Notes, etc) pertaining to this video visit through e-mail as they are currently without active MyChart. They have voiced understand that email is not considered secure and their health information could be viewed by someone other than the patient.   The patient was advised to call back or seek an in-person evaluation if the symptoms worsen or if the condition fails to improve as anticipated.    Angelia Kelp, PA-C

## 2023-12-18 NOTE — Patient Instructions (Signed)
  Harry Lindau, thank you for joining Angelia Kelp, PA-C for today's virtual visit.  While this provider is not your primary care provider (PCP), if your PCP is located in our provider database this encounter information will be shared with them immediately following your visit.   A [facility] MyChart account gives you access to today's visit and all your visits, tests, and labs performed at [facility]  click here if you don't have a [facility] MyChart account or go to [email or web address]  Consent: (Patient) Wesley Fletcher provided verbal consent for this virtual visit at the beginning of the encounter.  Current Medications:  Current Outpatient Medications:    OVER THE COUNTER MEDICATION, Mushroom complex daily, Disp: , Rfl:    [facility] JOHNS WORT PO, Take by mouth., Disp: , Rfl:   Current Facility-Administered Medications:    0.9 %  sodium chloride  infusion, 500 mL, Intravenous, Once, Danis, Roel Clarity III, MD   Medications ordered in this encounter:  No orders of the defined types were placed in this encounter.    *If you need refills on other medications prior to your next appointment, please contact your pharmacy*  Follow-Up: Call back or seek an in-person evaluation if the symptoms worsen or if the condition fails to improve as anticipated.  [facility] [phone]  Other Instructions Recommendation to follow up with Primary care office on new headache with visual disturbance and one episode of confusion   If you have been instructed to have an in-person evaluation today at a local Urgent Care facility, please use the link below. It will take you to a list of all of our available [facility], including address, phone number and hours of operation. Please do not delay care.  [facility]  If you or a family member do not have a primary care provider, use the link below to schedule a visit and establish care. When you  choose a [facility] primary care physician or advanced practice provider, you gain a long-term partner in health. Find a Primary Care Provider  Learn more about [facility] in-office and virtual care options:  - Get Care Now

## 2023-12-21 ENCOUNTER — Encounter (HOSPITAL_BASED_OUTPATIENT_CLINIC_OR_DEPARTMENT_OTHER): Payer: Self-pay

## 2023-12-21 ENCOUNTER — Other Ambulatory Visit: Payer: Self-pay

## 2023-12-21 ENCOUNTER — Inpatient Hospital Stay: Admitting: Family Medicine

## 2023-12-21 ENCOUNTER — Emergency Department (HOSPITAL_BASED_OUTPATIENT_CLINIC_OR_DEPARTMENT_OTHER)
Admission: EM | Admit: 2023-12-21 | Discharge: 2023-12-21 | Disposition: A | Discharge status: Home / Self Care | Attending: Emergency Medicine | Admitting: Emergency Medicine

## 2023-12-21 ENCOUNTER — Emergency Department (HOSPITAL_BASED_OUTPATIENT_CLINIC_OR_DEPARTMENT_OTHER)

## 2023-12-21 ENCOUNTER — Ambulatory Visit: Payer: Self-pay | Admitting: *Deleted

## 2023-12-21 ENCOUNTER — Ambulatory Visit: Admitting: Family Medicine

## 2023-12-21 DIAGNOSIS — H539 Unspecified visual disturbance: Secondary | ICD-10-CM

## 2023-12-21 DIAGNOSIS — H538 Other visual disturbances: Secondary | ICD-10-CM | POA: Diagnosis not present

## 2023-12-21 DIAGNOSIS — I10 Essential (primary) hypertension: Secondary | ICD-10-CM | POA: Diagnosis not present

## 2023-12-21 DIAGNOSIS — H532 Diplopia: Secondary | ICD-10-CM | POA: Diagnosis not present

## 2023-12-21 DIAGNOSIS — R404 Transient alteration of awareness: Secondary | ICD-10-CM | POA: Diagnosis not present

## 2023-12-21 DIAGNOSIS — R079 Chest pain, unspecified: Secondary | ICD-10-CM | POA: Diagnosis not present

## 2023-12-21 DIAGNOSIS — R519 Headache, unspecified: Secondary | ICD-10-CM | POA: Insufficient documentation

## 2023-12-21 LAB — CBC WITH DIFFERENTIAL/PLATELET
Abs Immature Granulocytes: 0.01 10*3/uL (ref 0.00–0.07)
Basophils Absolute: 0 10*3/uL (ref 0.0–0.1)
Basophils Relative: 1 %
Eosinophils Absolute: 0.1 10*3/uL (ref 0.0–0.5)
Eosinophils Relative: 1 %
HCT: 43.4 % (ref 39.0–52.0)
Hemoglobin: 14.7 g/dL (ref 13.0–17.0)
Immature Granulocytes: 0 %
Lymphocytes Relative: 34 %
Lymphs Abs: 1.5 10*3/uL (ref 0.7–4.0)
MCH: 27.9 pg (ref 26.0–34.0)
MCHC: 33.9 g/dL (ref 30.0–36.0)
MCV: 82.5 fL (ref 80.0–100.0)
Monocytes Absolute: 0.3 10*3/uL (ref 0.1–1.0)
Monocytes Relative: 7 %
Neutro Abs: 2.4 10*3/uL (ref 1.7–7.7)
Neutrophils Relative %: 57 %
Platelets: 215 10*3/uL (ref 150–400)
RBC: 5.26 MIL/uL (ref 4.22–5.81)
RDW: 12.8 % (ref 11.5–15.5)
WBC: 4.2 10*3/uL (ref 4.0–10.5)
nRBC: 0 % (ref 0.0–0.2)

## 2023-12-21 LAB — BASIC METABOLIC PANEL WITH GFR
Anion gap: 9 (ref 5–15)
BUN: 13 mg/dL (ref 6–20)
CO2: 26 mmol/L (ref 22–32)
Calcium: 9.1 mg/dL (ref 8.9–10.3)
Chloride: 104 mmol/L (ref 98–111)
Creatinine, Ser: 1.01 mg/dL (ref 0.61–1.24)
GFR, Estimated: >60 mL/min (ref >60)
Glucose, Bld: 108 mg/dL — ABNORMAL HIGH (ref 70–99)
Potassium: 4.1 mmol/L (ref 3.5–5.1)
Sodium: 139 mmol/L (ref 135–145)

## 2023-12-21 LAB — MAGNESIUM: Magnesium: 2.1 mg/dL (ref 1.7–2.4)

## 2023-12-21 LAB — TROPONIN T, HIGH SENSITIVITY: Troponin T High Sensitivity: <15 ng/L (ref <19)

## 2023-12-21 MED ORDER — IOHEXOL 350 MG/ML SOLN
100.0000 mL | Freq: Once | INTRAVENOUS | Status: AC | PRN
  Administered 2023-12-21: 75 mL via INTRAVENOUS

## 2023-12-21 NOTE — Telephone Encounter (Signed)
 Noted- ok to close.

## 2023-12-21 NOTE — ED Provider Notes (Signed)
 Emergency Department Provider Note   I have reviewed the triage vital signs and the nursing notes.   HISTORY  Chief Complaint Diplopia   HPI Wesley Fletcher is a 48 y.o. male with past history of hypertension and GERD presents to the emergency department with intermittent headache with associated visual disturbance.  He states that for the past 10 days he has had intermittent disturbances in his vision which seem to migrate.  He will have zigzag lines/expanding area of vision loss with a contralateral headache.  Several days later he had a central area of expanding blob like vision loss which then migrated to a crescent shape laterally.  Shortly afterward he would develop headaches.  No history of migraines.  No sudden onset, maximal intensity headache symptoms.  He developed double vision today while typing without the globular loss of vision and felt a delay in his motor and cognitive abilities for around 2 minutes.  No headache at that time.  No numbness or unilateral weakness.  Given the symptoms he presents today for evaluation.  Currently, he feels well with no vision disturbance or headache.   Past Medical History:  Diagnosis Date   Anxiety    Chest pain    Depression    GERD (gastroesophageal reflux disease)    HTN (hypertension)    mild, when under stress from divorce    Review of Systems  Constitutional: No fever/chills Eyes: Positive visual changes. Cardiovascular: Brief, central chest pain today. None currently.  Respiratory: Denies shortness of breath. Gastrointestinal: No abdominal pain.  No nausea, no vomiting.   Skin: Negative for rash. Neurological: Positive HA.   ____________________________________________   PHYSICAL EXAM:  VITAL SIGNS: ED Triage Vitals  Encounter Vitals Group     BP 12/21/23 1130 129/88     Pulse Rate 12/21/23 1130 85     Resp 12/21/23 1130 16     Temp 12/21/23 1130 98.1 F (36.7 C)     Temp src --      SpO2 12/21/23 1130 98 %      Weight 12/21/23 1133 200 lb (90.7 kg)   Constitutional: Alert and oriented. Well appearing and in no acute distress. Eyes: Conjunctivae are normal. PERRL. EOMI. Head: Atraumatic. Nose: No congestion/rhinnorhea. Mouth/Throat: Mucous membranes are moist. Neck: No stridor.   Cardiovascular: Normal rate, regular rhythm. Good peripheral circulation. Grossly normal heart sounds.   Respiratory: Normal respiratory effort.  No retractions. Lungs CTAB. Gastrointestinal: Soft and nontender. No distention.  Musculoskeletal: No lower extremity tenderness nor edema. No gross deformities of extremities. Neurologic:  Normal speech and language. No gross focal neurologic deficits are appreciated.  Skin:  Skin is warm, dry and intact. No rash noted.   ____________________________________________   LABS (all labs ordered are listed, but only abnormal results are displayed)  Labs Reviewed  BASIC METABOLIC PANEL WITH GFR - Abnormal; Notable for the following components:      Result Value   Glucose, Bld 108 (*)    All other components within normal limits  CBC WITH DIFFERENTIAL/PLATELET  MAGNESIUM  TROPONIN T, HIGH SENSITIVITY   ____________________________________________  EKG   EKG Interpretation Date/Time:  Monday December 21 2023 11:56:32 EDT Ventricular Rate:  70 PR Interval:  140 QRS Duration:  99 QT Interval:  391 QTC Calculation: 422 R Axis:   86  Text Interpretation: Sinus rhythm Confirmed by Abby Hocking [phone]) on 12/21/2023 11:59:33 AM        ____________________________________________  RADIOLOGY  DG Chest 2 View Result Date:  12/21/2023 CLINICAL DATA:  Chest pain EXAM: CHEST - 2 VIEW COMPARISON:  March 22, 2021 FINDINGS: The heart size and mediastinal contours are within normal limits. Both lungs are clear. The visualized skeletal structures are unremarkable. IMPRESSION: No active cardiopulmonary disease. Electronically Signed   By: Fredrich Jefferson M.D.   On: 12/21/2023  13:40    ____________________________________________   PROCEDURES  Procedure(s) performed:   Procedures   ____________________________________________   INITIAL IMPRESSION / ASSESSMENT AND PLAN / ED COURSE  Pertinent labs & imaging results that were available during my care of the patient were reviewed by me and considered in my medical decision making (see chart for details).   This patient is Presenting for Evaluation of HA, which does require a range of treatment options, and is a complaint that involves a high risk of morbidity and mortality.  The Differential Diagnoses includes but is not exclusive to subarachnoid hemorrhage, meningitis, encephalitis, previous head trauma, cavernous venous thrombosis, muscle tension headache, glaucoma, temporal arteritis, migraine or migraine equivalent, etc.   Critical Interventions-    Medications  iohexol (OMNIPAQUE) 350 MG/ML injection 100 mL (75 mLs Intravenous Contrast Given 12/21/23 1251)    Reassessment after intervention:     I did obtain Additional Historical Information from wife at bedside.   Clinical Laboratory Tests Ordered, included CBC without leukocytosis or anemia.  No acute kidney injury.  Magnesium normal. Troponin negative.   Radiologic Tests Ordered, included CTA head/neck and CXR. I independently interpreted the images and agree with radiology interpretation.   Cardiac Monitor Tracing which shows NSR.    Social Determinants of Health Risk patient is not an active smoker.   Consult complete with  Medical Decision Making: Summary:  Patient presents emergency department with intermittent headache and vision disturbances.  Episode today was a very brief, mental slowing type episode.  No focal deficits on exam to suspect acute stroke.  Plan for CTA head and neck along with screening blood work and reassess.  Reevaluation with update and discussion with   ***Considered admission***  Patient's presentation is  most consistent with acute presentation with potential threat to life or bodily function.   Disposition:   ____________________________________________  FINAL CLINICAL IMPRESSION(S) / ED DIAGNOSES  Final diagnoses:  None     NEW OUTPATIENT MEDICATIONS STARTED DURING THIS VISIT:  New Prescriptions   No medications on file    Note:  This document was prepared using Dragon voice recognition software and may include unintentional dictation errors.  Abby Hocking, MD, [facility]

## 2023-12-21 NOTE — ED Notes (Signed)
 Patient eating cheez its in bed. Pt states that EDP could eat them.

## 2023-12-21 NOTE — Discharge Instructions (Signed)

## 2023-12-21 NOTE — Telephone Encounter (Addendum)
 Copied from CRM [id number]. Topic: Clinical - Red Word Triage >> Dec 21, 2023 10:31 AM Jenice Mitts wrote: Red Word that prompted transfer to Nurse Triage: headache,double vision, delayed movement Reason for Disposition  Loss of vision or double vision  (Exception: Same as prior migraines.)    Zig Zag lines and double vision in right eye  Answer Assessment - Initial Assessment Questions 1. LOCATION: Where does it hurt?      I have an appt for today at 3:30 with y'all.      I had a headache, double vision and delayed movement of my right hand a few min. Ago.   I was wondering if I should go to the ED or wait and come to the appt this afternoon.    I'll tell you what has been happening from the top:  A week and a half ago I had my  first migraine.  I had a change in my vision too on my right side, eye then it went away.   It was a zig zag line and then I had a headache after the line went away.   It was not a intense headache and it occurred on the opposite side, my left side the headache occurred.     A week later I had the same thing happen with my left eye, the zig zag lines and a headache on the right side of my head that was not intense.   These went away on their own.    2 days later which was the night before last I woke up with a really bad migraine.  I was going to go to the ED.   I was having chills too.   I didn't go.  I made an appt for today at 3:30.      20 min ago I started seeing a dot and it grew in my central vision then went away.   Near the end of it I had a processing delay.  I was going to type on my computer and there was double vision in my right eye.   My right hand was delayed in movements.   No confusion or changes in speech.   It went away after about 5-6 min.   I'm ok right now.      Right now the symptoms are gone.   It happens quickly and then goes away.   It lasts 5-6 min.   I don't have a headache this time.   I took an aspirin when it happened this time.    No  numbness/tingling in my right hand when it happened a few min. Ago.    2. ONSET: When did the headache start? (Minutes, hours or days)      See above for details. 3. PATTERN: Does the pain come and go, or has it been constant since it started?     See above 4. SEVERITY: How bad is the pain? and What does it keep you from doing?  (e.g., Scale 1-10; mild, moderate, or severe)   - MILD (1-3): doesn't interfere with normal activities    - MODERATE (4-7): interferes with normal activities or awakens from sleep    - SEVERE (8-10): excruciating pain, unable to do any normal activities        Severe when the migraine woke me up during the night. 5. RECURRENT SYMPTOM: Have you ever had headaches before? If Yes, ask: When was the last time? and What happened that time?  No 6. CAUSE: What do you think is causing the headache?     I'm guessing a migraine is causing these but I'm not sure.   The delayed processing of movement of my right hand kind of got me more concerned. 7. MIGRAINE: Have you been diagnosed with migraine headaches? If Yes, ask: Is this headache similar?      No 8. HEAD INJURY: Has there been any recent injury to the head?      Not asked 9. OTHER SYMPTOMS: Do you have any other symptoms? (fever, stiff neck, eye pain, sore throat, cold symptoms)     See above 10. PREGNANCY: Is there any chance you are pregnant? When was your last menstrual period?       N/A  Protocols used: Headache-A-AH Appt for today at 3:30 with Dr. Bambi Lever was cancelled due to being referred to the ED.     FYI Only or Action Required?: FYI only for provider  Patient was last seen in primary care on 09/02/2023 by Aida House, MD. Called Nurse Triage reporting Headache. Symptoms began a week ago. Interventions attempted: OTC medications: aspirin. Symptoms are: gradually worsening.  Triage Disposition: Go to ED or PCP/Alternative with Approval  Patient/caregiver understands  and will follow disposition?:  Yes Going to ED

## 2023-12-21 NOTE — ED Notes (Signed)
 Patient transported to CT

## 2023-12-21 NOTE — Telephone Encounter (Signed)
 Agree with ER evaluation, ok to close

## 2023-12-21 NOTE — ED Triage Notes (Signed)
 Pt reports 10 days ago developed line/zig zag pattern in right peripheral vision, then developed left side head pain. Two more episodes have occurred. Reports migraine 2 nights ago, but doesn't have history of migraine Developed double vision today while typing. Felt like processing delay lasted approx 2 mins. Seems more fatigued per wife. Denies headache at this time. Vision normal at this time

## 2023-12-22 NOTE — Progress Notes (Signed)
 [facility]  PATIENT: Wesley Fletcher DOB: 04-21-1976  REFERRING DOCTOR OR PCP: Abby Hocking, MD SOURCE: Patient,  _________________________________   HISTORICAL  CHIEF COMPLAINT:  Chief Complaint  Patient presents with   New Patient (Initial Visit)    Pt in room 10. Alone. Urgent Internal referral for headache and transient alteration of awareness. Pt reports he has lines in vision , headaches are mild, double vision at times. No double vision now, typically happens with headaches.     HISTORY OF PRESENT ILLNESS:  I the pleasure to see your patient, Wesley Fletcher, at [facility] for neurologic consultation regarding his visual changes headaches and cognitive fog.  He is a 48 year old man with a history of hypertension who presented to the emergency room on 12/21/2023 with diplopia and headache.  His headache had started about 10 days earlier and he had visual disturbance with zigzag lines lasting 10 minutes and then this is followed by a contralateral headache for just 5-10 minutes  This has occurred on ether side.    IN between episodes he notes intact vision and no HA.    More recently, he had an episode noting a blob in the center of his vision OD that then migrated more peripherally.   As that improved, he had diplopia lasting a few minutes.  Then he noted a delay in his vision - he felt after he moved his hands that he did not see changes for a few seconds.  The symptoms went on for an additional 20 to 30 minutes   since HA's are short acting he only once took acetaminophen.      He woke up last week one day with a severe HA behind the left eye (as asleep, he does not know if any aura preceded this).  He also felt very cold.   This has only happened the one time. And lasted one hour   He sometimes wakes up with muscle spasms in hs leg.   He has had a few episodes of confusion feeling lightheaded and having difficulty coming up with words.    In  general the last 2 weeks he feels a mild brain fog.     Currently he has no pain.   He has no history of migraine.  No recent head or neck trauma.    No chiropractic manipulations or other neck-jerking activity.    He does note some stiffness but not pain in his neck.    He denies any other medical issues.     He reports his mom died from cancer that he reports was linked to the [facility] water toxicity issues.     His grandfather had ALS   Imaging was personally reviewed: CT scan of the head 12/21/2023 was normal.  CT angiogram 12/21/2023 was normal.  Laboratory: 12/21/2023: BMP CBC with differential, magnesium and troponin were normal or noncontributory. 09/02/2023: Lipid panel showed cholesterol 196, triglycerides 72, LDL elevated at 131.  Hemoglobin A1c was normal.  TSH was normal.  REVIEW OF SYSTEMS: Constitutional: No fevers, chills, sweats, or change in appetite Eyes: No visual changes, double vision, eye pain Ear, nose and throat: No hearing loss, ear pain, nasal congestion, sore throat Cardiovascular: No chest pain, palpitations Respiratory:  No shortness of breath at rest or with exertion.   No wheezes GastrointestinaI: No nausea, vomiting, diarrhea, abdominal pain, fecal incontinence Genitourinary:  No dysuria, urinary retention or frequency.  No nocturia. Musculoskeletal:  No neck pain, back pain Integumentary:  No rash, pruritus, skin lesions Neurological: as above Psychiatric: No depression at this time.  No anxiety Endocrine: No palpitations, diaphoresis, change in appetite, change in weigh or increased thirst Hematologic/Lymphatic:  No anemia, purpura, petechiae. Allergic/Immunologic: No itchy/runny eyes, nasal congestion, recent allergic reactions, rashes  ALLERGIES: Allergies  Allergen Reactions   Pollen Extract Other (See Comments)    Sneezing,cough, congestion,rhinitis    HOME MEDICATIONS:  Current Outpatient Medications:    OVER THE COUNTER MEDICATION,  Mushroom complex daily (Patient not taking: Reported on 12/23/2023), Disp: , Rfl:    ST JOHNS WORT PO, Take by mouth. (Patient not taking: Reported on 12/23/2023), Disp: , Rfl:   Current Facility-Administered Medications:    0.9 %  sodium chloride  infusion, 500 mL, Intravenous, Once, Danis, Cordelia Dessert, MD  PAST MEDICAL HISTORY: Past Medical History:  Diagnosis Date   Anxiety    Chest pain    Depression    GERD (gastroesophageal reflux disease)    HTN (hypertension)    mild, when under stress from divorce    PAST SURGICAL HISTORY: History reviewed. No pertinent surgical history.  FAMILY HISTORY: Family History  Problem Relation Age of Onset   Cancer Mother    Thyroid disease Mother    Hypertension Father    Ulcers Father    Other Son        dyslexia   Diabetes Maternal Uncle    Aneurysm Maternal Grandmother        brain   ALS Maternal Grandfather    Dementia Paternal Grandmother    Esophageal cancer Paternal Grandfather    ALS Maternal Great-grandfather        suspected; not diagnosed   Colon cancer Neg Hx    Stomach cancer Neg Hx    Pancreatic cancer Neg Hx     SOCIAL HISTORY: Social History   Socioeconomic History   Marital status: Married    Spouse name: Not on file   Number of children: Not on file   Years of education: Not on file   Highest education level: Not on file  Occupational History   Not on file  Tobacco Use   Smoking status: Former    Current packs/day: 0.00    Types: Cigarettes    Quit date: 07/19/2011    Years since quitting: 12.4   Smokeless tobacco: Former  Building services engineer status: Former  Substance and Sexual Activity   Alcohol use: Yes    Alcohol/week: 1.0 - 4.0 standard drink of alcohol    Types: 1 - 4 Standard drinks or equivalent per week    Comment: occassionally   Drug use: No   Sexual activity: Not on file  Other Topics Concern   Not on file  Social History Narrative   Work or School: Cytogeneticist, route Nature conservation officer       Home Situation: lives with wife Aleda Hurl and 4 children and another on the way       Spiritual Beliefs: [place]      Lifestyle: no regular exercise; diet is not good               Social Drivers of Corporate investment banker Strain: Not on file  Food Insecurity: Not on file  Transportation Needs: Not on file  Physical Activity: Not on file  Stress: Not on file  Social Connections: Not on file  Intimate Partner Violence: Unknown (10/07/2021)   Received from [facility]   HITS    Physically Hurt: Not  on file    Insult or Talk Down To: Not on file    Threaten Physical Harm: Not on file    Scream or Curse: Not on file       PHYSICAL EXAM  Vitals:   12/23/23 1445  BP: 119/82  Pulse: 77  SpO2: 98%  Weight: 209 lb 12.8 oz (95.2 kg)  Height: 5' 11 (1.803 m)    Body mass index is 29.26 kg/m.   General: The patient is well-developed and well-nourished and in no acute distress  HEENT:  Head is [place]/AT.  Sclera are anicteric.  Funduscopic exam shows normal optic discs and retinal vessels.  Neck: No carotid bruits are noted.  The neck is nontender.  Cardiovascular: The heart has a regular rate and rhythm with a normal S1 and S2. There were no murmurs, gallops or rubs.    Skin: Extremities are without rash or  edema.  Musculoskeletal:  Back is nontender  Neurologic Exam  Mental status: The patient is alert and oriented x 3 at the time of the examination. The patient has apparent normal recent and remote memory, with an apparently normal attention span and concentration ability.   Speech is normal.  Cranial nerves: Extraocular movements are full. Pupils are equal, round, and reactive to light and accomodation.  Visual fields are full.  Facial symmetry is present. There is good facial sensation to soft touch bilaterally.Facial strength is normal.  Trapezius and sternocleidomastoid strength is normal. No dysarthria is noted.  The tongue is midline, and the patient has  symmetric elevation of the soft palate. No obvious hearing deficits are noted.  Motor:  Muscle bulk is normal.   Tone is normal. Strength is  5 / 5 in all 4 extremities.   Sensory: Sensory testing is intact to pinprick, soft touch and vibration sensation in all 4 extremities.  Coordination: Cerebellar testing reveals good finger-nose-finger and heel-to-shin bilaterally.  Gait and station: Station is normal.   Gait is normal. Tandem gait is normal. Romberg is negative.   Reflexes: Deep tendon reflexes are symmetric and normal bilaterally.   Plantar responses are flexor.    DIAGNOSTIC DATA (LABS, IMAGING, TESTING) - I reviewed patient records, labs, notes, testing and imaging myself where available.  Lab Results  Component Value Date   WBC 4.2 12/21/2023   HGB 14.7 12/21/2023   HCT 43.4 12/21/2023   MCV 82.5 12/21/2023   PLT 215 12/21/2023      Component Value Date/Time   NA 139 12/21/2023 1150   K 4.1 12/21/2023 1150   CL 104 12/21/2023 1150   CO2 26 12/21/2023 1150   GLUCOSE 108 (H) 12/21/2023 1150   BUN 13 12/21/2023 1150   CREATININE 1.01 12/21/2023 1150   CALCIUM 9.1 12/21/2023 1150   PROT 6.7 09/02/2023 1129   ALBUMIN 4.2 09/02/2023 1129   AST 16 09/02/2023 1129   ALT 28 09/02/2023 1129   ALKPHOS 42 09/02/2023 1129   BILITOT 0.5 09/02/2023 1129   GFRNONAA >60 12/21/2023 1150   GFRAA  12/07/2010 1822    >60        The eGFR has been calculated using the MDRD equation. This calculation has not been validated in all clinical situations. eGFR's persistently <60 mL/min signify possible Chronic Kidney Disease.   Lab Results  Component Value Date   CHOL 196 09/02/2023   HDL 50.50 09/02/2023   LDLCALC 131 (H) 09/02/2023   LDLDIRECT 150.0 02/04/2019   TRIG 72.0 09/02/2023   CHOLHDL 4 09/02/2023  Lab Results  Component Value Date   HGBA1C 5.8 09/02/2023   Lab Results  Component Value Date   VITAMINB12 328 02/04/2019   Lab Results  Component Value Date    TSH 0.95 09/02/2023       ASSESSMENT AND PLAN  Vision disturbance - Plan: MR BRAIN W WO CONTRAST  Other vascular headache - Plan: MR BRAIN W WO CONTRAST   He is having visual symptoms followed by contralateral headache.  Although this could be a migraine variant, headache only last 10 to 20 minutes which would be unusual for migraine.  CT of the head and CT angiogram of the head and neck arteries looked normal.  Due to the sudden appearance of symptoms over the last couple weeks I feel we need to investigate this further and I have ordered an MRI of the brain to further evaluate.  Based on the findings, additional diagnostics test or therapy may be needed. I did not schedule follow-up but will see him back if he has significant new or worsening neurologic symptoms or based on the results of the MRI. Aaron Aas Thank you for asking him to see Mr. Davoli.  Please let me know if I can be of further assistance with him or other patients in the future.  Craven Crean A. Godwin Lat, MD, [facility] 12/23/2023, 3:13 PM Certified in Neurology, Clinical Neurophysiology, Sleep Medicine and Neuroimaging  [facility] [address] [place], [place] [address] [phone]

## 2023-12-23 ENCOUNTER — Ambulatory Visit: Admitting: Neurology

## 2023-12-23 ENCOUNTER — Encounter: Payer: Self-pay | Admitting: Neurology

## 2023-12-23 VITALS — BP 119/82 | HR 77 | Ht 71.0 in | Wt 209.8 lb

## 2023-12-23 DIAGNOSIS — G441 Vascular headache, not elsewhere classified: Secondary | ICD-10-CM

## 2023-12-23 DIAGNOSIS — H539 Unspecified visual disturbance: Secondary | ICD-10-CM | POA: Diagnosis not present

## 2024-01-07 ENCOUNTER — Telehealth: Payer: Self-pay | Admitting: Neurology

## 2024-01-07 NOTE — Telephone Encounter (Signed)
 MRI auth: [id number] exp. 01/07/24-04/06/24 for [facility]

## 2024-01-14 ENCOUNTER — Ambulatory Visit
Admission: RE | Admit: 2024-01-14 | Discharge: 2024-01-14 | Disposition: A | Discharge status: Home / Self Care | Source: Ambulatory Visit | Attending: Neurology | Admitting: Neurology

## 2024-01-14 DIAGNOSIS — H539 Unspecified visual disturbance: Secondary | ICD-10-CM | POA: Diagnosis not present

## 2024-01-14 DIAGNOSIS — G441 Vascular headache, not elsewhere classified: Secondary | ICD-10-CM

## 2024-01-14 MED ORDER — GADOPICLENOL 0.5 MMOL/ML IV SOLN
10.0000 mL | Freq: Once | INTRAVENOUS | Status: AC | PRN
  Administered 2024-01-14: 10 mL via INTRAVENOUS

## 2024-01-15 ENCOUNTER — Ambulatory Visit: Payer: Self-pay | Admitting: Neurology

## 2024-03-01 ENCOUNTER — Ambulatory Visit: Payer: BC Managed Care – PPO | Admitting: Family Medicine

## 2024-03-01 ENCOUNTER — Encounter: Payer: Self-pay | Admitting: Family Medicine

## 2024-03-01 VITALS — BP 108/58 | HR 80 | Temp 97.7°F | Ht 71.0 in | Wt 203.2 lb

## 2024-03-01 DIAGNOSIS — G43009 Migraine without aura, not intractable, without status migrainosus: Secondary | ICD-10-CM

## 2024-03-01 NOTE — Progress Notes (Unsigned)
   Established Patient Office Visit  Subjective   Patient ID: Wesley Fletcher, male    DOB: 1976/04/01  Age: 48 y.o. MRN: [id number]  Chief Complaint  Patient presents with   Medical Management of Chronic Issues    Pt is here for 6 month follow up. He reports that he was having severe headaches with neurologic signs. Work up showed normal CT of the head and MRI of brain which was unremarkable. States the symptoms did improve/resolve, but he is still getting aura and some headaches but not nearly as often. Last episode was 3 weeks ago. The headaches themselves are mild at this time. No further neurologic symptoms. I spent 20 minutes with patient today reviewing the ED notes, labs, imaging, and discussing headaches. Reviewed notes from Dr. Vear.     Current Outpatient Medications  Medication Instructions   OVER THE COUNTER MEDICATION Mushroom complex daily   ST JOHNS WORT PO Take by mouth.    Patient Active Problem List   Diagnosis Date Noted   Hyperlipidemia 12/06/2018   HTN (hypertension)    GERD (gastroesophageal reflux disease) 10/26/2013      Review of Systems  All other systems reviewed and are negative.     Objective:     BP (!) 108/58   Pulse 80   Temp 97.7 F (36.5 C) (Oral)   Ht 5' 11 (1.803 m)   Wt 203 lb 3.2 oz (92.2 kg)   SpO2 100%   BMI 28.34 kg/m  {Vitals History (Optional):23777}  Physical Exam Vitals reviewed.  Constitutional:      Appearance: Normal appearance. He is normal weight.  Cardiovascular:     Rate and Rhythm: Normal rate and regular rhythm.     Heart sounds: Normal heart sounds. No murmur heard. Neurological:     Mental Status: He is alert and oriented to person, place, and time. Mental status is at baseline.     Cranial Nerves: Cranial nerves 2-12 are intact.     Sensory: Sensation is intact.     Motor: No weakness or abnormal muscle tone.     Coordination: Coordination is intact.      No results found for any visits on  03/01/24.  {Labs (Optional):23779}  The 10-year ASCVD risk score (Arnett DK, et al., 2019) is: 1.6%    Assessment & Plan:  Atypical migraine   Symptoms essentially resolved at this point. He reports no further neurologic signs. He does have cervical disc degeneration in his neck on the CT scan.   No follow-ups on file.    Heron CHRISTELLA Sharper, MD

## 2024-09-06 ENCOUNTER — Encounter: Admitting: Family Medicine
